# Patient Record
Sex: Female | Born: 1950 | Race: White | Hispanic: No | Marital: Married | Smoking: Former smoker
Health system: Southern US, Community
[De-identification: ages and names within clinical notes are randomized; demographics above are authoritative.]

## PROBLEM LIST (undated history)

## (undated) DIAGNOSIS — M1990 Unspecified osteoarthritis, unspecified site: Secondary | ICD-10-CM

## (undated) HISTORY — PX: TOTAL HIP ARTHROPLASTY: SHX124

## (undated) HISTORY — PX: BREAST BIOPSY: SHX20

---

## 2004-03-03 ENCOUNTER — Ambulatory Visit: Payer: Self-pay | Admitting: Family Medicine

## 2005-06-26 ENCOUNTER — Ambulatory Visit: Payer: Self-pay | Admitting: Family Medicine

## 2005-07-03 ENCOUNTER — Ambulatory Visit: Payer: Self-pay | Admitting: Gastroenterology

## 2006-07-12 ENCOUNTER — Ambulatory Visit: Payer: Self-pay | Admitting: Nurse Practitioner

## 2008-07-27 ENCOUNTER — Ambulatory Visit: Payer: Self-pay | Admitting: Family Medicine

## 2010-06-07 ENCOUNTER — Ambulatory Visit: Payer: Self-pay | Admitting: Family Medicine

## 2011-06-13 ENCOUNTER — Ambulatory Visit: Payer: Self-pay | Admitting: Family Medicine

## 2015-11-29 ENCOUNTER — Ambulatory Visit
Admission: EM | Admit: 2015-11-29 | Discharge: 2015-11-29 | Disposition: A | Discharge status: Home / Self Care | Payer: Medicare Other | Attending: Family Medicine | Admitting: Family Medicine

## 2015-11-29 ENCOUNTER — Ambulatory Visit (INDEPENDENT_AMBULATORY_CARE_PROVIDER_SITE_OTHER): Payer: Medicare Other

## 2015-11-29 DIAGNOSIS — S93401A Sprain of unspecified ligament of right ankle, initial encounter: Secondary | ICD-10-CM

## 2015-11-29 MED ORDER — HYDROCODONE-ACETAMINOPHEN 5-325 MG PO TABS: ORAL_TABLET | ORAL | 0 refills | Status: DC

## 2015-11-29 NOTE — ED Triage Notes (Signed)
Patient complains of right ankle pain that occurred last night after falling off of a step stool. Patient reports that she has noticed swelling and bruising and is unable to put pressure on her foot.

## 2015-11-29 NOTE — ED Provider Notes (Signed)
[facility]    CSN: [id number] Arrival date & time: 11/29/15  1742     History   Chief Complaint Chief Complaint  Patient presents with  . Ankle Pain    HPI Jane [name] is a 65 y.o. female.   65 yo female with a c/o right ankle pain since last night after falling of a step stool and twisting her foot. Today noticed more swelling and pain with weight bearing.    The history is provided by the patient.    History reviewed. No pertinent past medical history.  There are no active problems to display for this patient.   Past Surgical History:  Procedure Laterality Date  . BREAST BIOPSY Left   . TOTAL HIP ARTHROPLASTY Left     OB History    No data available       Home Medications    Prior to Admission medications   Medication Sig Start Date End Date Taking? Authorizing Provider  HYDROcodone-acetaminophen (NORCO/VICODIN) 5-325 MG tablet 1-2 tabs po bid prn 11/29/15   Norval Gable, MD    Family History No family history on file.  Social History Social History  Substance Use Topics  . Smoking status: Never Smoker  . Smokeless tobacco: Never Used  . Alcohol use Yes     Comment: occasionally     Allergies   Patient has no known allergies.   Review of Systems Review of Systems   Physical Exam Triage Vital Signs ED Triage Vitals  Enc Vitals Group     BP 11/29/15 1838 (!) 147/90     Pulse Rate 11/29/15 1838 95     Resp 11/29/15 1838 17     Temp 11/29/15 1838 98.9 F (37.2 C)     Temp Source 11/29/15 1838 Oral     SpO2 11/29/15 1838 97 %     Weight --      Height 11/29/15 1837 5' 9.5" (1.765 m)     Head Circumference --      Peak Flow --      Pain Score 11/29/15 1839 8     Pain Loc --      Pain Edu? --      Excl. in [place]? --    No data found.   Updated Vital Signs BP (!) 147/90 (BP Location: Left Arm)   Pulse 95   Temp 98.9 F (37.2 C) (Oral)   Resp 17   Ht 5' 9.5" (1.765 m)   SpO2 97%   Visual Acuity Right Eye  Distance:   Left Eye Distance:   Bilateral Distance:    Right Eye Near:   Left Eye Near:    Bilateral Near:     Physical Exam  Constitutional: She appears well-developed and well-nourished. No distress.  Musculoskeletal:       Right ankle: She exhibits swelling and ecchymosis. She exhibits normal range of motion, no deformity, no laceration and normal pulse. Tenderness. Medial malleolus tenderness found. Achilles tendon normal.  Skin: She is not diaphoretic.  Nursing note and vitals reviewed.    UC Treatments / Results  Labs (all labs ordered are listed, but only abnormal results are displayed) Labs Reviewed - No data to display  EKG  EKG Interpretation None       Radiology Dg Ankle Complete Right  Result Date: 11/29/2015 CLINICAL DATA:  Status post fall off stool, with injury to right ankle. Pain and swelling about the medial aspect of the right ankle. Initial encounter. EXAM:  RIGHT ANKLE - COMPLETE 3+ VIEW COMPARISON:  None. FINDINGS: There is no evidence of fracture or dislocation. The ankle mortise is intact; the interosseous space is within normal limits. No talar tilt or subluxation is seen. A plantar calcaneal spur is noted. The joint spaces are preserved. Diffuse soft tissue swelling is noted about the ankle. IMPRESSION: No evidence of fracture or dislocation. Electronically Signed   By: Garald Balding M.D.   On: 11/29/2015 19:04    Procedures Procedures (including critical care time)  Medications Ordered in UC Medications - No data to display   Initial Impression / Assessment and Plan / UC Course  I have reviewed the triage vital signs and the nursing notes.  Pertinent labs & imaging results that were available during my care of the patient were reviewed by me and considered in my medical decision making (see chart for details).  Clinical Course       Final Clinical Impressions(s) / UC Diagnoses   Final diagnoses:  Sprain of right ankle, unspecified  ligament, initial encounter    New Prescriptions Discharge Medication List as of 11/29/2015  7:25 PM    START taking these medications   Details  HYDROcodone-acetaminophen (NORCO/VICODIN) 5-325 MG tablet 1-2 tabs po bid prn, Print       1. x-ray results (negative for fracture) and diagnosis reviewed with patient 2. rx as per orders above; reviewed possible side effects, interactions, risks and benefits   3. Recommend supportive treatment with rest, ice, elevation, otc analgesics 4. Follow-up prn if symptoms worsen or don't improve   Norval Gable, MD 11/29/15 [id number]

## 2016-04-05 ENCOUNTER — Other Ambulatory Visit: Payer: Self-pay | Admitting: Family Medicine

## 2016-04-05 DIAGNOSIS — Z1231 Encounter for screening mammogram for malignant neoplasm of breast: Secondary | ICD-10-CM

## 2016-05-17 ENCOUNTER — Ambulatory Visit
Admission: RE | Admit: 2016-05-17 | Discharge: 2016-05-17 | Disposition: A | Discharge status: Home / Self Care | Payer: Medicare Other | Source: Ambulatory Visit | Attending: Family Medicine | Admitting: Family Medicine

## 2016-05-17 DIAGNOSIS — Z1231 Encounter for screening mammogram for malignant neoplasm of breast: Secondary | ICD-10-CM | POA: Diagnosis present

## 2016-11-29 ENCOUNTER — Other Ambulatory Visit: Payer: Self-pay

## 2016-11-29 ENCOUNTER — Encounter: Payer: Self-pay | Admitting: *Deleted

## 2016-12-08 ENCOUNTER — Ambulatory Visit: Payer: Medicare Other | Admitting: Anesthesiology

## 2016-12-08 ENCOUNTER — Encounter
Admission: RE | Disposition: A | Discharge status: Home / Self Care | Payer: Self-pay | Source: Ambulatory Visit | Attending: Unknown Physician Specialty

## 2016-12-08 ENCOUNTER — Ambulatory Visit
Admission: RE | Admit: 2016-12-08 | Discharge: 2016-12-08 | Disposition: A | Discharge status: Home / Self Care | Payer: Medicare Other | Source: Ambulatory Visit | Attending: Unknown Physician Specialty | Admitting: Unknown Physician Specialty

## 2016-12-08 DIAGNOSIS — Z79899 Other long term (current) drug therapy: Secondary | ICD-10-CM | POA: Diagnosis not present

## 2016-12-08 DIAGNOSIS — R0683 Snoring: Secondary | ICD-10-CM | POA: Insufficient documentation

## 2016-12-08 DIAGNOSIS — G5601 Carpal tunnel syndrome, right upper limb: Secondary | ICD-10-CM | POA: Diagnosis present

## 2016-12-08 DIAGNOSIS — Z96642 Presence of left artificial hip joint: Secondary | ICD-10-CM | POA: Insufficient documentation

## 2016-12-08 DIAGNOSIS — E669 Obesity, unspecified: Secondary | ICD-10-CM | POA: Diagnosis not present

## 2016-12-08 DIAGNOSIS — M1990 Unspecified osteoarthritis, unspecified site: Secondary | ICD-10-CM | POA: Diagnosis not present

## 2016-12-08 DIAGNOSIS — Z87891 Personal history of nicotine dependence: Secondary | ICD-10-CM | POA: Insufficient documentation

## 2016-12-08 DIAGNOSIS — Z820 Family history of epilepsy and other diseases of the nervous system: Secondary | ICD-10-CM | POA: Insufficient documentation

## 2016-12-08 HISTORY — PX: CARPAL TUNNEL RELEASE: SHX101

## 2016-12-08 HISTORY — DX: Unspecified osteoarthritis, unspecified site: M19.90

## 2016-12-08 SURGERY — CARPAL TUNNEL RELEASE
Anesthesia: General | Laterality: Right | Wound class: Clean

## 2016-12-08 MED ORDER — BUPIVACAINE HCL (PF) 0.5 % IJ SOLN
INTRAMUSCULAR | Status: DC | PRN
  Administered 2016-12-08: 7 mL

## 2016-12-08 MED ORDER — ONDANSETRON HCL 4 MG/2ML IJ SOLN: 4.0000 mg | Freq: Once | INTRAMUSCULAR | Status: DC | PRN

## 2016-12-08 MED ORDER — OXYCODONE HCL 5 MG/5ML PO SOLN: 5.0000 mg | Freq: Once | ORAL | Status: DC | PRN

## 2016-12-08 MED ORDER — PROPOFOL 10 MG/ML IV BOLUS
INTRAVENOUS | Status: DC | PRN
  Administered 2016-12-08: 150 mg via INTRAVENOUS

## 2016-12-08 MED ORDER — MIDAZOLAM HCL 5 MG/5ML IJ SOLN
INTRAMUSCULAR | Status: DC | PRN
  Administered 2016-12-08: 2 mg via INTRAVENOUS

## 2016-12-08 MED ORDER — DEXAMETHASONE SODIUM PHOSPHATE 4 MG/ML IJ SOLN
INTRAMUSCULAR | Status: DC | PRN
  Administered 2016-12-08: 8 mg via INTRAVENOUS

## 2016-12-08 MED ORDER — LACTATED RINGERS IV SOLN: INTRAVENOUS | Status: DC

## 2016-12-08 MED ORDER — LACTATED RINGERS IV SOLN
INTRAVENOUS | Status: DC
  Administered 2016-12-08: 09:00:00 via INTRAVENOUS

## 2016-12-08 MED ORDER — ACETAMINOPHEN 10 MG/ML IV SOLN: 1000.0000 mg | Freq: Once | INTRAVENOUS | Status: DC | PRN

## 2016-12-08 MED ORDER — LIDOCAINE HCL (CARDIAC) 20 MG/ML IV SOLN
INTRAVENOUS | Status: DC | PRN
  Administered 2016-12-08: 50 mg via INTRATRACHEAL

## 2016-12-08 MED ORDER — FENTANYL CITRATE (PF) 100 MCG/2ML IJ SOLN
INTRAMUSCULAR | Status: DC | PRN
  Administered 2016-12-08: 50 ug via INTRAVENOUS

## 2016-12-08 MED ORDER — ONDANSETRON HCL 4 MG/2ML IJ SOLN
INTRAMUSCULAR | Status: DC | PRN
  Administered 2016-12-08: 4 mg via INTRAVENOUS

## 2016-12-08 MED ORDER — NORCO 5-325 MG PO TABS: 1.0000 | ORAL_TABLET | Freq: Four times a day (QID) | ORAL | 0 refills | Status: AC | PRN

## 2016-12-08 MED ORDER — FENTANYL CITRATE (PF) 100 MCG/2ML IJ SOLN
25.0000 ug | INTRAMUSCULAR | Status: DC | PRN
Start: 2016-12-08 — End: 2016-12-08

## 2016-12-08 MED ORDER — OXYCODONE HCL 5 MG PO TABS: 5.0000 mg | ORAL_TABLET | Freq: Once | ORAL | Status: DC | PRN

## 2016-12-08 SURGICAL SUPPLY — 26 items
BANDAGE ELASTIC 2 LF NS (GAUZE/BANDAGES/DRESSINGS) ×3 IMPLANT
BNDG ESMARK 4X12 TAN STRL LF (GAUZE/BANDAGES/DRESSINGS) ×3 IMPLANT
BNDG STRETCH 4X75 STRL LF (GAUZE/BANDAGES/DRESSINGS) ×3 IMPLANT
COVER LIGHT HANDLE FLEXIBLE (MISCELLANEOUS) ×6 IMPLANT
CUFF TOURN SGL QUICK 18 (TOURNIQUET CUFF) ×3 IMPLANT
DURAPREP 26ML APPLICATOR (WOUND CARE) ×3 IMPLANT
GAUZE SPONGE 4X4 12PLY STRL (GAUZE/BANDAGES/DRESSINGS) ×3 IMPLANT
GLOVE BIO SURGEON STRL SZ7.5 (GLOVE) ×3 IMPLANT
GLOVE BIO SURGEON STRL SZ8 (GLOVE) ×6 IMPLANT
GLOVE INDICATOR 8.0 STRL GRN (GLOVE) ×3 IMPLANT
GOWN STRL REUS W/ TWL LRG LVL3 (GOWN DISPOSABLE) ×2 IMPLANT
GOWN STRL REUS W/TWL LRG LVL3 (GOWN DISPOSABLE) ×4
KIT ROOM TURNOVER OR (KITS) ×3 IMPLANT
NS IRRIG 500ML POUR BTL (IV SOLUTION) ×3 IMPLANT
PACK EXTREMITY ARMC (MISCELLANEOUS) ×3 IMPLANT
PAD GROUND ADULT SPLIT (MISCELLANEOUS) ×3 IMPLANT
PADDING CAST 2X4YD ST (MISCELLANEOUS) ×2
PADDING CAST BLEND 2X4 STRL (MISCELLANEOUS) ×1 IMPLANT
SOL PREP PVP 2OZ (MISCELLANEOUS) ×3
SOLUTION PREP PVP 2OZ (MISCELLANEOUS) ×1 IMPLANT
SPLINT CAST 1 STEP 3X12 (MISCELLANEOUS) ×3 IMPLANT
STOCKINETTE 4X48 STRL (DRAPES) ×3 IMPLANT
STRAP BODY AND KNEE 60X3 (MISCELLANEOUS) ×3 IMPLANT
SUT ETHILON 4-0 (SUTURE) ×2
SUT ETHILON 4-0 FS2 18XMFL BLK (SUTURE) ×1
SUTURE ETHLN 4-0 FS2 18XMF BLK (SUTURE) ×1 IMPLANT

## 2016-12-08 NOTE — Op Note (Signed)
DATE OF SURGERY:  12/08/2016  PATIENT NAME:  Jane [name]   DOB: 1950/05/20  MRN: [id number]  PRE-OPERATIVE DIAGNOSIS: Right carpal tunnel syndrome  POST-OPERATIVE DIAGNOSIS:  Same  PROCEDURE: Right carpal tunnel release  SURGEON: Dr. Leanor Kail, Brooke Bonito. M.D.  ANESTHESIA: Gen.   INDICATIONS FOR SURGERY: [name] is a 66 y.o. year old female with a long history of numbness and paresthesias in the right hand. Nerve conduction studies demonstrated findings consistent with moderate  median nerve compression.The patient had not seen any significant improvement despite conservative nonsurgical intervention. After discussion of the risks and benefits of surgical intervention, the patient expressed understanding of the risks benefits and agreed with plans for carpal tunnel release.   PROCEDURE IN DETAIL: The patient was taken the operating room where satisfactory general anesthesia was achieved. A tourniquet was placed on the patient's right upper arm.The right hand and arm were prepped  and draped in the usual sterile fashion. A "time-out" was performed as per usual protocol. The hand and forearm were exsanguinated using an Esmarch and the tourniquet was inflated to 250 mmHg.  An incision was made just ulnar to the thenar palmar crease. Dissection was carried down through the palmar fascia to the transverse carpal ligament. The transverse carpal ligament was sharply incised, taking care to protect the underlying structures within the carpal tunnel. Complete release of the transverse carpal ligament was achieved. There was no evidence of a mass or proliferative synovitis within the carpal tunnel. The median nerve did appear to be slightly flattened. The wound was irrigated with saline. The tourniquet was released at this time. It had been up for about 9 minutes. Bleeding was controlled with digital pressure and coagulation cautery. I did inject the subcutaneous tissue of the wound with about 5 cc  of 0.5% Marcaine without epinephrine. The skin was then re-approximated with interrupted sutures of #4-0 nylon. A sterile dressing was applied followed by application of a volar splint.  The patient was awakened and transferred to a stretcher bed.  The patient tolerated the procedure well and was transported to the PACU in stable condition. Blood loss was negligible.  Dr. Mariel Kansky. M.D.

## 2016-12-08 NOTE — Discharge Instructions (Signed)
General Anesthesia, Adult, Care After These instructions provide you with information about caring for yourself after your procedure. Your health care provider may also give you more specific instructions. Your treatment has been planned according to current medical practices, but problems sometimes occur. Call your health care provider if you have any problems or questions after your procedure. What can I expect after the procedure? After the procedure, it is common to have:  Vomiting.  A sore throat.  Mental slowness.  It is common to feel:  Nauseous.  Cold or shivery.  Sleepy.  Tired.  Sore or achy, even in parts of your body where you did not have surgery.  Follow these instructions at home: For at least 24 hours after the procedure:  Do not: ? Participate in activities where you could fall or become injured. ? Drive. ? Use heavy machinery. ? Drink alcohol. ? Take sleeping pills or medicines that cause drowsiness. ? Make important decisions or sign legal documents. ? Take care of children on your own.  Rest. Eating and drinking  If you vomit, drink water, juice, or soup when you can drink without vomiting.  Drink enough fluid to keep your urine clear or pale yellow.  Make sure you have little or no nausea before eating solid foods.  Follow the diet recommended by your health care provider. General instructions  Have a responsible adult stay with you until you are awake and alert.  Return to your normal activities as told by your health care provider. Ask your health care provider what activities are safe for you.  Take over-the-counter and prescription medicines only as told by your health care provider.  If you smoke, do not smoke without supervision.  Keep all follow-up visits as told by your health care provider. This is important. Contact a health care provider if:  You continue to have nausea or vomiting at home, and medicines are not helpful.  You  cannot drink fluids or start eating again.  You cannot urinate after 8-12 hours.  You develop a skin rash.  You have fever.  You have increasing redness at the site of your procedure. Get help right away if:  You have difficulty breathing.  You have chest pain.  You have unexpected bleeding.  You feel that you are having a life-threatening or urgent problem. This information is not intended to replace advice given to you by your health care provider. Make sure you discuss any questions you have with your health care provider. Document Released: 04/03/2000 Document Revised: 05/31/2015 Document Reviewed: 12/10/2014 Elsevier Interactive Patient Education  2018 [employer]  Elevation  RTC in about 2 weeks

## 2016-12-08 NOTE — Transfer of Care (Signed)
Immediate Anesthesia Transfer of Care Note  Patient: Jane [name]  Procedure(s) Performed: CARPAL TUNNEL RELEASE (Right )  Patient Location: PACU  Anesthesia Type: General  Level of Consciousness: awake, alert  and patient cooperative  Airway and Oxygen Therapy: Patient Spontanous Breathing and Patient connected to supplemental oxygen  Post-op Assessment: Post-op Vital signs reviewed, Patient's Cardiovascular Status Stable, Respiratory Function Stable, Patent Airway and No signs of Nausea or vomiting  Post-op Vital Signs: Reviewed and stable  Complications: No apparent anesthesia complications

## 2016-12-08 NOTE — Anesthesia Procedure Notes (Signed)
Procedure Name: LMA Insertion Performed by: Londell Moh, CRNA Pre-anesthesia Checklist: Patient identified, Emergency Drugs available, Suction available, Timeout performed and Patient being monitored Patient Re-evaluated:Patient Re-evaluated prior to induction Oxygen Delivery Method: Circle system utilized Preoxygenation: Pre-oxygenation with 100% oxygen Induction Type: IV induction LMA: LMA inserted LMA Size: 4.0 Number of attempts: 1 Placement Confirmation: positive ETCO2 and breath sounds checked- equal and bilateral Tube secured with: Tape

## 2016-12-08 NOTE — H&P (Signed)
  H and P reviewed. No changes. Uploaded at later date. 

## 2016-12-08 NOTE — Anesthesia Preprocedure Evaluation (Signed)
Anesthesia Evaluation  Patient identified by MRN, date of birth, ID band Patient awake    History of Anesthesia Complications Negative for: history of anesthetic complications  Airway Mallampati: II  TM Distance: >3 FB Neck ROM: Full    Dental  (+)    Pulmonary former smoker,  Snoring    Pulmonary exam normal breath sounds clear to auscultation       Cardiovascular Exercise Tolerance: Good negative cardio ROS Normal cardiovascular exam Rhythm:Regular Rate:Normal     Neuro/Psych negative neurological ROS     GI/Hepatic negative GI ROS,   Endo/Other  negative endocrine ROS  Renal/GU negative Renal ROS     Musculoskeletal  (+) Arthritis ,   Abdominal   Peds  Hematology negative hematology ROS (+)   Anesthesia Other Findings Obesity   Reproductive/Obstetrics                             Anesthesia Physical Anesthesia Plan  ASA: II  Anesthesia Plan: General   Post-op Pain Management:    Induction: Intravenous  PONV Risk Score and Plan: 2 and Ondansetron and Dexamethasone  Airway Management Planned: LMA  Additional Equipment:   Intra-op Plan:   Post-operative Plan: Extubation in OR  Informed Consent: I have reviewed the patients History and Physical, chart, labs and discussed the procedure including the risks, benefits and alternatives for the proposed anesthesia with the patient or authorized representative who has indicated his/her understanding and acceptance.     Plan Discussed with: CRNA  Anesthesia Plan Comments:         Anesthesia Quick Evaluation

## 2016-12-08 NOTE — Anesthesia Postprocedure Evaluation (Signed)
Anesthesia Post Note  Patient: Jane [name]  Procedure(s) Performed: CARPAL TUNNEL RELEASE (Right )  Patient location during evaluation: PACU Anesthesia Type: General Level of consciousness: awake and alert, oriented and patient cooperative Pain management: pain level controlled Vital Signs Assessment: post-procedure vital signs reviewed and stable Respiratory status: spontaneous breathing, nonlabored ventilation and respiratory function stable Cardiovascular status: blood pressure returned to baseline and stable Postop Assessment: adequate PO intake Anesthetic complications: no    Darrin Nipper

## 2017-02-23 ENCOUNTER — Other Ambulatory Visit: Payer: Self-pay | Admitting: Unknown Physician Specialty

## 2017-02-23 DIAGNOSIS — R200 Anesthesia of skin: Secondary | ICD-10-CM

## 2017-02-23 DIAGNOSIS — M5013 Cervical disc disorder with radiculopathy, cervicothoracic region: Secondary | ICD-10-CM

## 2017-03-13 ENCOUNTER — Ambulatory Visit
Admission: RE | Admit: 2017-03-13 | Discharge: 2017-03-13 | Disposition: A | Discharge status: Home / Self Care | Payer: Medicare Other | Source: Ambulatory Visit | Attending: Unknown Physician Specialty | Admitting: Unknown Physician Specialty

## 2017-03-13 ENCOUNTER — Encounter (INDEPENDENT_AMBULATORY_CARE_PROVIDER_SITE_OTHER): Payer: Self-pay

## 2017-03-13 DIAGNOSIS — R9389 Abnormal findings on diagnostic imaging of other specified body structures: Secondary | ICD-10-CM | POA: Insufficient documentation

## 2017-03-13 DIAGNOSIS — M5013 Cervical disc disorder with radiculopathy, cervicothoracic region: Secondary | ICD-10-CM | POA: Diagnosis not present

## 2017-03-13 DIAGNOSIS — R200 Anesthesia of skin: Secondary | ICD-10-CM

## 2017-04-19 ENCOUNTER — Encounter: Payer: Medicare Other | Attending: Nurse Practitioner | Admitting: Nurse Practitioner

## 2017-04-19 DIAGNOSIS — M1990 Unspecified osteoarthritis, unspecified site: Secondary | ICD-10-CM | POA: Diagnosis not present

## 2017-04-19 DIAGNOSIS — I252 Old myocardial infarction: Secondary | ICD-10-CM | POA: Diagnosis not present

## 2017-04-19 DIAGNOSIS — I10 Essential (primary) hypertension: Secondary | ICD-10-CM | POA: Insufficient documentation

## 2017-04-19 DIAGNOSIS — I739 Peripheral vascular disease, unspecified: Secondary | ICD-10-CM | POA: Insufficient documentation

## 2017-04-19 DIAGNOSIS — G629 Polyneuropathy, unspecified: Secondary | ICD-10-CM | POA: Insufficient documentation

## 2017-04-19 DIAGNOSIS — T8131XA Disruption of external operation (surgical) wound, not elsewhere classified, initial encounter: Secondary | ICD-10-CM | POA: Diagnosis present

## 2017-04-19 DIAGNOSIS — X58XXXA Exposure to other specified factors, initial encounter: Secondary | ICD-10-CM | POA: Insufficient documentation

## 2017-04-21 NOTE — Progress Notes (Signed)
Jane [name], Jane [name] ([id number]) Visit Report for 04/19/2017 Abuse/Suicide Risk Screen Details Patient Name: [name], [name] Date of Service: 04/19/2017 8:00 AM Medical Record Number: [id number] Patient Account Number: [id number] Date of Birth/Sex: [birth year] (66 y.o. Female) Treating RN: Montey Hora Primary Care Suzana Sohail: Lavera Guise Other Clinician: Referring Martin Belling: Meade Maw Treating Wofford Stratton/Extender: Cathie Olden in Treatment: 0 Abuse/Suicide Risk Screen Items Answer ABUSE/SUICIDE RISK SCREEN: Has anyone close to you tried to hurt or harm you recentlyo No Do you feel uncomfortable with anyone in your familyo No Has anyone forced you do things that you didnot want to doo No Do you have any thoughts of harming yourselfo No Patient displays signs or symptoms of abuse and/or neglect. No Electronic Signature(s) Signed: 04/19/2017 3:01:24 PM By: Montey Hora Entered By: Montey Hora on 04/19/2017 08:12:57 Jane [name] ([id number]) -------------------------------------------------------------------------------- Activities of Daily Living Details Patient Name: Jane Bouche A. Date of Service: 04/19/2017 8:00 AM Medical Record Number: [id number] Patient Account Number: [id number] Date of Birth/Sex: [birth year] (66 y.o. Female) Treating RN: Montey Hora Primary Care Sedra Morfin: Lavera Guise Other Clinician: Referring Brixton Franko: Meade Maw Treating Laiza Veenstra/Extender: Cathie Olden in Treatment: 0 Activities of Daily Living Items Answer Activities of Daily Living (Please select one for each item) Drive Automobile Not Able Take Medications Completely Able Use Telephone Completely Able Care for Appearance Completely Able Use Toilet Completely Able Bath / Shower Completely Able Dress Self Need Assistance Feed Self Completely Able Walk Completely Able Get In / Out Bed Completely [place] for Self Completely Able Electronic Signature(s) Signed: 04/19/2017 3:01:24 PM By: Montey Hora Entered By: Montey Hora on 04/19/2017 08:13:36 Jane [name] ([id number]) -------------------------------------------------------------------------------- Education Assessment Details Patient Name: Jane Bouche A. Date of Service: 04/19/2017 8:00 AM Medical Record Number: [id number] Patient Account Number: [id number] Date of Birth/Sex: 16-Jun-1950 (66 y.o. Female) Treating RN: Montey Hora Primary Care Gamaliel Charney: Lavera Guise Other Clinician: Referring Ragnar Waas: Meade Maw Treating Randol Zumstein/Extender: Cathie Olden in Treatment: 0 Primary Learner Assessed: Patient Learning Preferences/Education Level/Primary Language Learning Preference: Explanation, Demonstration Highest Education Level: College or Above Preferred Language: English Cognitive Barrier Assessment/Beliefs Language Barrier: No Translator Needed: No Memory Deficit: No Emotional Barrier: No Cultural/Religious Beliefs Affecting Medical Care: No Physical Barrier Assessment Impaired Vision: No Impaired Hearing: No Decreased Hand dexterity: No Knowledge/Comprehension Assessment Knowledge Level: Medium Comprehension Level: Medium Ability to understand written Medium instructions: Ability to understand verbal Medium instructions: Motivation Assessment Anxiety Level: Calm Cooperation: Cooperative Education Importance: Acknowledges Need Interest in Health Problems: Asks Questions Perception: Coherent Willingness to Engage in Self- Medium Management Activities: Readiness to Engage in Self- Medium Management Activities: Electronic Signature(s) Signed: 04/19/2017 3:01:24 PM By: Montey Hora Entered By: Montey Hora on 04/19/2017 08:13:57 Jane [name] ([id number]) -------------------------------------------------------------------------------- Fall Risk Assessment  Details Patient Name: Jane Bouche A. Date of Service: 04/19/2017 8:00 AM Medical Record Number: [id number] Patient Account Number: [id number] Date of Birth/Sex: June 13, 1950 (66 y.o. Female) Treating RN: Montey Hora Primary Care Pocahontas Cohenour: Lavera Guise Other Clinician: Referring Davinder Haff: Meade Maw Treating Marian Meneely/Extender: Cathie Olden in Treatment: 0 Fall Risk Assessment Items Have you had 2 or more falls in the last 12 monthso 0 No Have you had any fall that resulted in injury in the last 12 monthso 0 No FALL RISK ASSESSMENT: History of falling - immediate or within 3 months 0 No Secondary diagnosis 0 No Ambulatory aid None/bed rest/wheelchair/nurse 0 Yes Crutches/cane/walker 0 No Furniture 0 No IV Access/Saline  Lock 0 No Gait/Training Normal/bed rest/immobile 0 Yes Weak 10 Yes Impaired 0 No Mental Status Oriented to own ability 0 Yes Electronic Signature(s) Signed: 04/19/2017 3:01:24 PM By: Montey Hora Entered By: Montey Hora on 04/19/2017 08:14:17 Jane [name] ([id number]) -------------------------------------------------------------------------------- Nutrition Risk Assessment Details Patient Name: Jane Bouche A. Date of Service: 04/19/2017 8:00 AM Medical Record Number: [id number] Patient Account Number: [id number] Date of Birth/Sex: 1950/02/11 (66 y.o. Female) Treating RN: Montey Hora Primary Care Filippo Puls: Lavera Guise Other Clinician: Referring Jayson Waterhouse: Meade Maw Treating Allayah Raineri/Extender: Cathie Olden in Treatment: 0 Height (in): Weight (lbs): Body Mass Index (BMI): Nutrition Risk Assessment Items NUTRITION RISK SCREEN: I have an illness or condition that made me change the kind and/or amount of 0 No food I eat I eat fewer than two meals per day 0 No I eat few fruits and vegetables, or milk products 0 No I have three or more drinks of beer, liquor or wine almost every day 0 No I have tooth or mouth problems  that make it hard for me to eat 0 No I don't always have enough money to buy the food I need 0 No I eat alone most of the time 0 No I take three or more different prescribed or over-the-counter drugs a day 1 Yes Without wanting to, I have lost or gained 10 pounds in the last six months 0 No I am not always physically able to shop, cook and/or feed myself 0 No Nutrition Protocols Good Risk Protocol 0 No interventions needed Moderate Risk Protocol Electronic Signature(s) Signed: 04/19/2017 3:01:24 PM By: Montey Hora Entered By: Montey Hora on 04/19/2017 08:14:22

## 2017-04-23 ENCOUNTER — Other Ambulatory Visit: Payer: Self-pay | Admitting: Neurosurgery

## 2017-04-23 DIAGNOSIS — D480 Neoplasm of uncertain behavior of bone and articular cartilage: Secondary | ICD-10-CM

## 2017-04-23 DIAGNOSIS — G9589 Other specified diseases of spinal cord: Secondary | ICD-10-CM

## 2017-04-26 ENCOUNTER — Encounter: Payer: Medicare Other | Admitting: Nurse Practitioner

## 2017-04-26 DIAGNOSIS — T8131XA Disruption of external operation (surgical) wound, not elsewhere classified, initial encounter: Secondary | ICD-10-CM | POA: Diagnosis not present

## 2017-04-27 NOTE — Progress Notes (Signed)
BRONTE, SABADO ([id number]) Visit Report for 04/19/2017 Chief Complaint Document Details Patient Name: Jane, [name] A. Date of Service: 04/19/2017 8:00 AM Medical Record Number: [id number] Patient Account Number: [id number] Date of Birth/Sex: 1950-06-10 (66 y.o. F) Treating RN: Ahmed Prima Primary Care Provider: Lavera Guise Other Clinician: Referring Provider: Lavera Guise Treating Provider/Extender: Cathie Olden in Treatment: 0 Information Obtained from: Patient Chief Complaint She is here for evaluation of a cervical neck surgical site dehiscence Electronic Signature(s) Signed: 04/19/2017 10:50:43 AM By: Lawanda Cousins Entered By: Lawanda Cousins on 04/19/2017 10:50:43 Hanline, Shevawn A. ([id number]) -------------------------------------------------------------------------------- Debridement Details Patient Name: Jane Bouche A. Date of Service: 04/19/2017 8:00 AM Medical Record Number: [id number] Patient Account Number: [id number] Date of Birth/Sex: 20-Jun-1950 (66 y.o. F) Treating RN: Ahmed Prima Primary Care Provider: Lavera Guise Other Clinician: Referring Provider: Lavera Guise Treating Provider/Extender: Cathie Olden in Treatment: 0 Debridement Performed for Wound #1 Posterior Neck Assessment: Performed By: Physician Lawanda Cousins, NP Debridement Type: Debridement Pre-procedure Verification/Time Yes - 08:43 Out Taken: Start Time: 08:44 Pain Control: Lidocaine 4% Topical Solution Total Area Debrided (L x W): 0.8 (cm) x 0.8 (cm) = 0.64 (cm) Tissue and other material Non-Viable, Slough, Subcutaneous, Fibrin/Exudate, Slough debrided: Level: Skin/Subcutaneous Tissue Debridement Description: Excisional Instrument: Curette Bleeding: Minimum Hemostasis Achieved: Pressure End Time: 08:46 Procedural Pain: 0 Post Procedural Pain: 0 Response to Treatment: Procedure was tolerated well Post Debridement Measurements of Total Wound Length: (cm) 0.8 Width:  (cm) 0.8 Depth: (cm) 0.7 Volume: (cm) 0.352 Character of Wound/Ulcer Post Debridement: Requires Further Debridement Post Procedure Diagnosis Same as Pre-procedure Electronic Signature(s) Signed: 04/19/2017 10:37:20 AM By: Lawanda Cousins Signed: 04/23/2017 4:22:27 PM By: Alric Quan Entered By: Lawanda Cousins on 04/19/2017 10:37:19 Achorn, [name] ([id number]) -------------------------------------------------------------------------------- HPI Details Patient Name: Jane Bouche A. Date of Service: 04/19/2017 8:00 AM Medical Record Number: [id number] Patient Account Number: [id number] Date of Birth/Sex: [birth year] (66 y.o. F) Treating RN: Ahmed Prima Primary Care Provider: Lavera Guise Other Clinician: Referring Provider: Lavera Guise Treating Provider/Extender: Cathie Olden in Treatment: 0 History of Present Illness HPI Description: 04/19/17-She is here for evaluation of a cervical neck wound dehiscence. 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 Sutures were removed on 4/4 there is no disc noted concerned and the crevice of the posterior cervical spine. She'll follow-up appointment on 4/9 where she was prescribed Augmentin for 2 weeks and consult to wound clinic. She presents with limited range of motion to her neck which is not new postoperatively. There is a small area of nonviable tissue and a natural crevice in her posterior neck. We will treat with santyl, they are to call if unable to afford. She will follow up next week. Electronic Signature(s) Signed: 04/19/2017 11:02:51 AM By: Lawanda Cousins Previous Signature: 04/19/2017 10:53:37 AM Version By: Lawanda Cousins Entered By: Lawanda Cousins on 04/19/2017 11:02:50 Jane [name] ([id number]) -------------------------------------------------------------------------------- Physical Exam Details Patient Name: Jane, CUBERO A. Date of Service: 04/19/2017 8:00  AM Medical Record Number: [id number] Patient Account Number: [id number] Date of Birth/Sex: Aug 22, 1950 (66 y.o. F) Treating RN: Ahmed Prima Primary Care Provider: Lavera Guise Other Clinician: Referring Provider: Lavera Guise Treating Provider/Extender: Cathie Olden in Treatment: 0 Respiratory respirations are even and unlabored. clear throughout. Cardiovascular s1 s2 regular rate and rhythm. Musculoskeletal ambulates with no assistive devices. Psychiatric appears to have appropriate insight and judgement to medical care. oriented x4. calm, cooperative. Electronic Signature(s) Signed: 04/19/2017 11:31:40 AM By: Lawanda Cousins  Entered By: Lawanda Cousins on 04/19/2017 11:31:39 Jane [name] ([id number]) -------------------------------------------------------------------------------- Physician Orders Details Patient Name: Jane Bouche A. Date of Service: 04/19/2017 8:00 AM Medical Record Number: [id number] Patient Account Number: [id number] Date of Birth/Sex: [birth year] (66 y.o. F) Treating RN: Ahmed Prima Primary Care Provider: Lavera Guise Other Clinician: Referring Provider: Lavera Guise Treating Provider/Extender: Cathie Olden in Treatment: 0 Verbal / Phone Orders: Yes Clinician: Pinkerton, Debi Read Back and Verified: Yes Diagnosis Coding Wound Cleansing Wound #1 Posterior Neck o Clean wound with Normal Saline. o Cleanse wound with mild soap and water Anesthetic (add to Medication List) Wound #1 Posterior Neck o Topical Lidocaine 4% cream applied to wound bed prior to debridement (In Clinic Only). Skin Barriers/Peri-Wound Care Wound #1 Posterior Neck o Skin Prep Primary Wound Dressing Wound #1 Posterior Neck o Saline moistened gauze o Santyl Ointment Secondary Dressing Wound #1 Posterior Neck o Dry Gauze o Boardered Foam Dressing - or telfa island Dressing Change Frequency Wound #1 Posterior Neck o Change dressing every  day. Follow-up Appointments Wound #1 Posterior Neck o Return Appointment in 1 week. Additional Orders / Instructions Wound #1 Posterior Neck o Increase protein intake. o Other: - multivitamin Patient Medications Allergies: No Known Allergies Notifications Medication Indication Start End lidocaine Jane [name], [name] ([id number]) Notifications Medication Indication Start End DOSE 1 - topical 4 % cream - 1 cream topical Santyl 04/20/2017 DOSE topical 250 unit/gram ointment - ointment topical to wound daily Electronic Signature(s) Signed: 04/19/2017 10:24:33 AM By: Lawanda Cousins Entered By: Lawanda Cousins on 04/19/2017 10:24:32 Jane [name] ([id number]) -------------------------------------------------------------------------------- Prescription 04/19/2017 Patient Name: Jane Bouche A. Provider: Lawanda Cousins NP Date of Birth: Aug 25, 1950 NPI#: [id number] Sex: F DEA#: [id number] Phone #: [phone] License #: Patient Address: [facility] [place], [place] [address] [address], [place], [place] [address] [phone] Allergies No Known Allergies Medication Medication: Route: Strength: Form: lidocaine 4 % topical cream topical 4% cream Class: TOPICAL LOCAL ANESTHETICS Dose: Frequency / Time: Indication: 1 1 cream topical Number of Refills: Number of Units: 0 Generic Substitution: Start Date: End Date: One Time Use: Substitution Permitted No Note to Pharmacy: Signature(s): Date(s): Electronic Signature(s) Signed: 04/19/2017 3:26:23 PM By: Lawanda Cousins Entered By: Lawanda Cousins on 04/19/2017 10:24:34 Jane [name], Jane [name] ([id number]) --------------------------------------------------------------------------------  Problem List Details Patient Name: Jane Bouche A. Date of Service: 04/19/2017 8:00 AM Medical Record Number: [id number] Patient Account Number:  [id number] Date of Birth/Sex: Apr 06, 1950 (66 y.o. 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 ([id number]) -------------------------------------------------------------------------------- Progress Note Details Patient Name: Jane Bouche A. Date of Service: 04/19/2017 8:00 AM Medical Record Number: [id number] Patient Account Number: [id number] Date of Birth/Sex: 05-Aug-1950 (66 y.o. F) Treating RN: Ahmed Prima Primary Care Provider: Lavera Guise Other Clinician: Referring Provider: Lavera Guise Treating Provider/Extender: Cathie Olden in Treatment: 0 Subjective Chief Complaint Information obtained from Patient She is here for evaluation of a cervical neck surgical site dehiscence History of Present Illness (HPI)  04/19/17-She is here for evaluation of a cervical neck wound dehiscence. 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 Sutures were removed on 4/4 there is no disc noted concerned and the crevice of the posterior cervical spine. She'll follow-up appointment on 4/9 where she was prescribed Augmentin for 2 weeks and consult to wound clinic. She presents with limited range of motion to her neck which is not new postoperatively. There is a small area of nonviable tissue and a natural crevice  in her posterior neck. We will treat with santyl, they are to call if unable to afford. She will follow up next week. Wound History Patient presents with 1 open wound that has been present for approximately 3 weeks. Patient has been treating wound in the following manner: open to air. Laboratory tests have not been performed in the last month. Patient reportedly has not tested positive for an antibiotic resistant organism. Patient reportedly has not tested positive for osteomyelitis. Patient reportedly has not had testing performed to evaluate circulation in the legs. Patient History Information obtained from Patient. Allergies No Known Allergies Family History Lung Disease - Father, No family history of Cancer, Diabetes, Heart Disease, Hereditary Spherocytosis, Hypertension, Kidney Disease, Seizures, Stroke, Thyroid Problems, Tuberculosis. Social History Never smoker, Marital Status - Married, Alcohol Use - Rarely, Drug Use - No History, Caffeine Use - Daily. Medical History Hematologic/Lymphatic Denies history of Anemia, Hemophilia, Human Immunodeficiency Virus, Lymphedema, Sickle Cell Disease Respiratory Denies history of Aspiration, Asthma, Chronic Obstructive Pulmonary Disease (COPD), Pneumothorax, Sleep Apnea, Tuberculosis Cardiovascular Denies history of Angina, Arrhythmia, Congestive Heart Failure, Coronary Artery Disease, Deep Vein Thrombosis, Hypertension, Hypotension, Myocardial Infarction, Peripheral Arterial Disease, Peripheral Venous Disease, Phlebitis, Vasculitis Gastrointestinal Denies history of Cirrhosis , Colitis, Crohn s, Hepatitis A, Hepatitis B, Hepatitis C Jane [name], [name] ([id number]) Endocrine Denies history of Type I Diabetes, Type II Diabetes Immunological Denies history of Lupus Erythematosus, Raynaud s, Scleroderma Musculoskeletal Patient has history of Osteoarthritis Denies history of Gout, Rheumatoid Arthritis, Osteomyelitis Neurologic Patient has  history of Neuropathy - right hand Denies history of Dementia, Quadriplegia, Paraplegia, Seizure Disorder Oncologic Denies history of Received Chemotherapy, Received Radiation Medical And Surgical History Notes Musculoskeletal spinal cord mass removed and screws inserted March 26 2017 Review of Systems (ROS) Constitutional Symptoms (General Health) The patient has no complaints or symptoms. Eyes The patient has no complaints or symptoms. Ear/Nose/Mouth/Throat The patient has no complaints or symptoms. Hematologic/Lymphatic The patient has no complaints or symptoms. Respiratory The patient has no complaints or symptoms. Cardiovascular The patient has no complaints or symptoms. Gastrointestinal The patient has no complaints or symptoms. Genitourinary The patient has no complaints or symptoms. Immunological The patient has no complaints or symptoms. Integumentary (Skin) The patient has no complaints or symptoms. Musculoskeletal The patient has no complaints or symptoms. Neurologic The patient has no complaints or symptoms. Oncologic The patient has no complaints or symptoms. Psychiatric The patient has no complaints or symptoms. Objective Constitutional Vitals Time Taken: 8:21 AM, Height: 69 in, Source: Measured, Weight: 260 lbs, Source: Measured, BMI: 38.4, Temperature: Jane [name], [name] ([id number]) 98.3 F, Pulse: 60 bpm, Respiratory Rate: 18 breaths/min, Blood Pressure: 133/83 mmHg. Respiratory respirations are even and unlabored. clear throughout. Cardiovascular s1 s2 regular rate and rhythm. Musculoskeletal ambulates with no assistive devices. Psychiatric appears to have appropriate insight and judgement to medical care. oriented x4. calm, cooperative. Integumentary (Hair, Skin) Wound #1 status is Open. Original cause of wound was Surgical Injury.  The wound is located on the Posterior Neck. The wound measures 0.8cm length x 0.8cm width x 0.6cm depth; 0.503cm^2  area and 0.302cm^3 volume. There is Fat Layer (Subcutaneous Tissue) Exposed exposed. There is no tunneling or undermining noted. There is a small amount of serous drainage noted. The wound margin is flat and intact. There is no granulation within the wound bed. There is a large (67-100%) amount of necrotic tissue within the wound bed including Adherent Slough. The periwound skin appearance exhibited: Ecchymosis. The periwound skin appearance did not exhibit: Callus, Crepitus, Excoriation, Induration, Rash, Scarring, Dry/Scaly, Maceration, Atrophie Blanche, Cyanosis, Hemosiderin Staining, Mottled, Pallor, Rubor, Erythema. Periwound temperature was noted as No Abnormality. 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 There was a Excisional Skin/Subcutaneous Tissue Debridement with a total area of 0.64 sq cm performed by Lawanda Cousins, NP. With the following instrument(s): Curette. to remove Non-Viable tissue/material Material removed includes Subcutaneous Tissue, and Slough, [place] after achieving pain control using Lidocaine 4% Topical Solution. No specimens were taken. A time out was conducted at 08:43, prior to the start of the procedure. A Minimum amount of bleeding was controlled with Pressure. The procedure was tolerated well with a pain level of 0 throughout and a pain level of 0 following the procedure. Post Debridement Measurements: 0.8cm length x 0.8cm width x 0.7cm depth; 0.352cm^3 volume. Character of Wound/Ulcer Post Debridement requires further debridement. Post procedure Diagnosis Wound #1: Same as Pre-Procedure Jane [name], [name] ([id number]) Plan Wound Cleansing: Wound  #1 Posterior Neck: Clean wound with Normal Saline. Cleanse wound with mild soap and water Anesthetic (add to Medication List): Wound #1 Posterior Neck: Topical Lidocaine 4% cream applied to wound bed prior to debridement (In Clinic Only). Skin Barriers/Peri-Wound Care: Wound #1 Posterior Neck: Skin Prep Primary Wound Dressing: Wound #1 Posterior Neck: Saline moistened gauze Santyl Ointment Secondary Dressing: Wound #1 Posterior Neck: Dry Gauze Boardered Foam Dressing - or telfa island Dressing Change Frequency: Wound #1 Posterior Neck: Change dressing every day. Follow-up Appointments: Wound #1 Posterior Neck: Return Appointment in 1 week. Additional Orders / Instructions: Wound #1 Posterior Neck: Increase protein intake. Other: - multivitamin The following medication(s) was prescribed: lidocaine topical 4 % cream 1 1 cream topical was prescribed at facility Santyl topical 250 unit/gram ointment ointment topical to wound daily starting 04/20/2017 1. Continue with Augmentin 2. Probiotic or yogurt while taking antibiotic 3. Santyl daily 4. Follow-up next week Electronic Signature(s) Signed: 04/19/2017 11:32:39 AM By: Lawanda Cousins Entered By: Lawanda Cousins on 04/19/2017 11:32:39 Jane [name] ([id number]) -------------------------------------------------------------------------------- ROS/PFSH Details Patient Name: Jane Bouche A. Date of Service: 04/19/2017 8:00 AM Medical Record Number: [id number] Patient Account Number: [id number] Date of Birth/Sex: 1950-08-16 (66 y.o. F) Treating RN: Jane [name] Primary Care Provider: Lavera Guise Other Clinician: Referring Provider: Lavera Guise Treating Provider/Extender: Cathie Olden in Treatment: 0 Information Obtained From Patient Wound History Do you currently have one or more open woundso Yes How many open wounds do you currently haveo 1 Approximately how long have you had your woundso 3 weeks How have you been  treating your wound(s) until nowo open to air Has your wound(s) ever healed and then re-openedo No Have you had any lab work done in the  past montho No Have you tested positive for an antibiotic resistant organism (MRSA, VRE)o No Have you tested positive for osteomyelitis (bone infection)o No Have you had any tests for circulation on your legso No Constitutional Symptoms (General Health) Complaints and Symptoms: No Complaints or Symptoms Eyes Complaints and Symptoms: No Complaints or Symptoms Ear/Nose/Mouth/Throat Complaints and Symptoms: No Complaints or Symptoms Hematologic/Lymphatic Complaints and Symptoms: No Complaints or Symptoms Medical History: Negative for: Anemia; Hemophilia; Human Immunodeficiency Virus; Lymphedema; Sickle Cell Disease Respiratory Complaints and Symptoms: No Complaints or Symptoms Medical History: Negative for: Aspiration; Asthma; Chronic Obstructive Pulmonary Disease (COPD); Pneumothorax; Sleep Apnea; Tuberculosis Cardiovascular Complaints and Symptoms: No Complaints or Symptoms Jane [name], [name] ([id number]) Medical History: Negative for: Angina; Arrhythmia; Congestive Heart Failure; Coronary Artery Disease; Deep Vein Thrombosis; Hypertension; Hypotension; Myocardial Infarction; Peripheral Arterial Disease; Peripheral Venous Disease; Phlebitis; Vasculitis Gastrointestinal Complaints and Symptoms: No Complaints or Symptoms Medical History: Negative for: Cirrhosis ; Colitis; Crohnos; Hepatitis A; Hepatitis B; Hepatitis C Endocrine Medical History: Negative for: Type I Diabetes; Type II Diabetes Genitourinary Complaints and Symptoms: No Complaints or Symptoms Immunological Complaints and Symptoms: No Complaints or Symptoms Medical History: Negative for: Lupus Erythematosus; Raynaudos; Scleroderma Integumentary (Skin) Complaints and Symptoms: No Complaints or Symptoms Musculoskeletal Complaints and Symptoms: No Complaints or  Symptoms Medical History: Positive for: Osteoarthritis Negative for: Gout; Rheumatoid Arthritis; Osteomyelitis Past Medical History Notes: spinal cord mass removed and screws inserted March 26 2017 Neurologic Complaints and Symptoms: No Complaints or Symptoms Medical History: Positive for: Neuropathy - right hand Negative for: Dementia; Quadriplegia; Paraplegia; Seizure Disorder Oncologic Complaints and Symptoms: No Complaints or Symptoms Jane [name], [name] ([id number]) Medical History: Negative for: Received Chemotherapy; Received Radiation Psychiatric Complaints and Symptoms: No Complaints or Symptoms Immunizations Pneumococcal Vaccine: Received Pneumococcal Vaccination: Yes Immunization Notes: up to date Implantable Devices Family and Social History Cancer: No; Diabetes: No; Heart Disease: No; Hereditary Spherocytosis: No; Hypertension: No; Kidney Disease: No; Lung Disease: Yes - Father; Seizures: No; Stroke: No; Thyroid Problems: No; Tuberculosis: No; Never smoker; Marital Status - Married; Alcohol Use: Rarely; Drug Use: No History; Caffeine Use: Daily; Financial Concerns: No; Food, Clothing or Shelter Needs: No; Support System Lacking: No; Transportation Concerns: No; Advanced Directives: No; Patient does not want information on Advanced Directives Electronic Signature(s) Signed: 04/19/2017 3:01:24 PM By: Jane [name] Signed: 04/19/2017 3:26:23 PM By: Lawanda Cousins Entered By: Jane [name] on 04/19/2017 08:20:52 Jane [name] ([id number]) -------------------------------------------------------------------------------- SuperBill Details Patient Name: Jane Bouche A. Date of Service: 04/19/2017 Medical Record Number: [id number] Patient Account Number: [id number] Date of Birth/Sex: 29-May-1950 (67 y.o. 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 G95.9 Disease of spinal cord, unspecified S11.80XS Unspecified open wound of other specified part of neck, sequela Facility Procedures CPT4 Code: 46270350 Description: 09381 - WOUND CARE VISIT-LEV 2 EST PT Modifier: Quantity: 1 CPT4 Code: 82993716 Description: [facility] - DEB SUBQ TISSUE 20 SQ CM/< ICD-10 Diagnosis Description S11.80XS Unspecified open wound of other specified part of neck, se Modifier: quela Quantity: 1 Physician Procedures CPT4 Code: 9678938 Description: WC PHYS LEVEL 3 o NEW PT ICD-10 Diagnosis Description B01.7 Neoplasm of uncertain behavior of bone and articular cart G95.9 Disease of spinal cord, unspecified S11.80XS Unspecified open wound of other specified part of neck, s Modifier: ilage equela Quantity: 1 CPT4 Code: [id number] Description: 11042 - WC PHYS  SUBQ TISS 20 SQ CM ICD-10 Diagnosis Description S11.80XS Unspecified open wound of other specified part of neck, s Modifier: equela Quantity: 1 Electronic Signature(s) Signed: 04/19/2017 2:29:37 PM By: Alric Quan Signed: 04/19/2017 3:26:23 PM By: Lawanda Cousins Previous Signature: 04/19/2017 11:33:01 AM Version By: Lawanda Cousins Entered By: Alric Quan on 04/19/2017 14:29:36

## 2017-04-27 NOTE — Progress Notes (Signed)
GISSELLA, NIBLACK ([id number]) Visit Report for 04/19/2017 Allergy List Details Patient Name: Jane [name], [name] Date of Service: 04/19/2017 8:00 AM Medical Record Number: [id number] Patient Account Number: [id number] Date of Birth/Sex: Aug 23, 1950 (66 y.o. F) Treating RN: Montey Hora Primary Care Blimie Vaness: Lavera Guise Other Clinician: Referring Trayce Caravello: Lavera Guise Treating Ashlen Kiger/Extender: Lawanda Cousins Weeks in Treatment: 0 Allergies Active Allergies No Known Allergies Allergy Notes Electronic Signature(s) Signed: 04/19/2017 3:01:24 PM By: Montey Hora Entered By: Montey Hora on 04/19/2017 08:12:35 [name] ([id number]) -------------------------------------------------------------------------------- Arrival Information Details Patient Name: Jane Bouche A. Date of Service: 04/19/2017 8:00 AM Medical Record Number: [id number] Patient Account Number: [id number] Date of Birth/Sex: March 08, 1950 (66 y.o. F) Treating RN: Montey Hora Primary Care Nephtali Docken: Lavera Guise Other Clinician: Referring Cj Edgell: Lavera Guise Treating Elija Mccamish/Extender: Cathie Olden in Treatment: 0 Visit Information Patient Arrived: Ambulatory Arrival Time: 08:11 Accompanied By: spouse Transfer Assistance: None Patient Identification Verified: Yes Secondary Verification Process Completed: Yes Electronic Signature(s) Signed: 04/19/2017 3:01:24 PM By: Montey Hora Entered By: Montey Hora on 04/19/2017 08:12:26 [name] ([id number]) -------------------------------------------------------------------------------- Clinic Level of Care Assessment Details Patient Name: Jane Bouche A. Date of Service: 04/19/2017 8:00 AM Medical Record Number: [id number] Patient Account Number: [id number] Date of Birth/Sex: 1951/01/08 (66 y.o. F) Treating RN: Ahmed Prima Primary Care Jakyra Kenealy: Lavera Guise Other Clinician: Referring Trellis Guirguis: Lavera Guise Treating Irvine Glorioso/Extender:  Cathie Olden in Treatment: 0 Clinic Level of Care Assessment Items TOOL 1 Quantity Score X - Use when EandM and Procedure is performed on INITIAL visit 1 0 ASSESSMENTS - Nursing Assessment / Reassessment X - General Physical Exam (combine w/ comprehensive assessment (listed just below) when 1 20 performed on new pt. evals) X- 1 25 Comprehensive Assessment (HX, ROS, Risk Assessments, Wounds Hx, etc.) ASSESSMENTS - Wound and Skin Assessment / Reassessment []  - Dermatologic / Skin Assessment (not related to wound area) 0 ASSESSMENTS - Ostomy and/or Continence Assessment and Care []  - Incontinence Assessment and Management 0 []  - 0 Ostomy Care Assessment and Management (repouching, etc.) PROCESS - Coordination of Care X - Simple Patient / Family Education for ongoing care 1 15 []  - 0 Complex (extensive) Patient / Family Education for ongoing care []  - 0 Staff obtains Programmer, systems, Records, Test Results / Process Orders []  - 0 Staff telephones HHA, Nursing Homes / Clarify orders / etc []  - 0 Routine Transfer to another Facility (non-emergent condition) []  - 0 Routine Hospital Admission (non-emergent condition) X- 1 15 New Admissions / Biomedical engineer / Ordering NPWT, Apligraf, etc. []  - 0 Emergency Hospital Admission (emergent condition) PROCESS - Special Needs []  - Pediatric / Minor Patient Management 0 []  - 0 Isolation Patient Management []  - 0 Hearing / Language / Visual special needs []  - 0 Assessment of Community assistance (transportation, D/C planning, etc.) []  - 0 Additional assistance / Altered mentation []  - 0 Support Surface(s) Assessment (bed, cushion, seat, etc.) Chavarin, Verdie A. ([id number]) INTERVENTIONS - Miscellaneous []  - External ear exam 0 []  - 0 Patient Transfer (multiple staff / Civil Service fast streamer / Similar devices) []  - 0 Simple Staple / Suture removal (25 or less) []  - 0 Complex Staple / Suture removal (26 or more) []  - 0 Hypo/Hyperglycemic  Management (do not check if billed separately) []  - 0 Ankle / Brachial Index (ABI) - do not check if billed separately Has the patient been seen at the hospital within the last three years: Yes Total Score: 75 Level Of Care: New/Established - Level 2 Electronic Signature(s) Signed:  04/23/2017 4:22:27 PM By: Alric Quan Entered By: Alric Quan on 04/19/2017 14:29:27 [name] ([id number]) -------------------------------------------------------------------------------- Encounter Discharge Information Details Patient Name: Jane Bouche A. Date of Service: 04/19/2017 8:00 AM Medical Record Number: [id number] Patient Account Number: [id number] Date of Birth/Sex: [birth year] (66 y.o. F) Treating RN: Ahmed Prima Primary Care Lydia Meng: Lavera Guise Other Clinician: Referring Zakya Halabi: Lavera Guise Treating Sahana Boyland/Extender: Cathie Olden in Treatment: 0 Encounter Discharge Information Items Discharge Pain Level: 0 Discharge Condition: Stable Ambulatory Status: Ambulatory Discharge Destination: Home Transportation: Private Auto Accompanied By: husband Schedule Follow-up Appointment: Yes Medication Reconciliation completed and No provided to Patient/Care Livian Vanderbeck: Provided on Clinical Summary of Care: 04/19/2017 Form Type Recipient Paper Patient [facility] Electronic Signature(s) Signed: 04/20/2017 4:04:12 PM By: Roger Shelter Entered By: Roger Shelter on 04/19/2017 08:57:25 Depree, Gyanna A. ([id number]) -------------------------------------------------------------------------------- Lower Extremity Assessment Details Patient Name: Jane Bouche A. Date of Service: 04/19/2017 8:00 AM Medical Record Number: [id number] Patient Account Number: [id number] Date of Birth/Sex: 1950-09-14 (66 y.o. F) Treating RN: Montey Hora Primary Care Gustav Knueppel: Lavera Guise Other Clinician: Referring Shamell Suarez: Lavera Guise Treating Anthony Tamburo/Extender: Cathie Olden in  Treatment: 0 Electronic Signature(s) Signed: 04/19/2017 3:01:24 PM By: Montey Hora Entered By: Montey Hora on 04/19/2017 08:30:47 Schleicher, [name] ([id number]) -------------------------------------------------------------------------------- Multi Wound Chart Details Patient Name: Jane Bouche A. Date of Service: 04/19/2017 8:00 AM Medical Record Number: [id number] Patient Account Number: [id number] Date of Birth/Sex: 05-10-1950 (66 y.o. F) Treating RN: Ahmed Prima Primary Care Jarred Purtee: Lavera Guise Other Clinician: Referring Mattalyn Anderegg: Lavera Guise Treating Jionni Helming/Extender: Cathie Olden in Treatment: 0 Vital Signs Height(in): 69 Pulse(bpm): 60 Weight(lbs): 260 Blood Pressure(mmHg): 133/83 Body Mass Index(BMI): 38 Temperature(F): 98.3 Respiratory Rate 18 (breaths/min): Photos: [1:No Photos] [N/A:N/A] Wound Location: [1:Neck - Posterior] [N/A:N/A] Wounding Event: [1:Surgical Injury] [N/A:N/A] Primary Etiology: [1:Open Surgical Wound] [N/A:N/A] Comorbid History: [1:Osteoarthritis, Neuropathy] [N/A:N/A] Date Acquired: [1:03/26/2017] [N/A:N/A] Weeks of Treatment: [1:0] [N/A:N/A] Wound Status: [1:Open] [N/A:N/A] Measurements L x W x D [1:0.8x0.8x0.6] [N/A:N/A] (cm) Area (cm) : [1:0.503] [N/A:N/A] Volume (cm) : [1:0.302] [N/A:N/A] Classification: [1:Full Thickness Without Exposed Support Structures] [N/A:N/A] Exudate Amount: [1:Small] [N/A:N/A] Exudate Type: [1:Serous] [N/A:N/A] Exudate Color: [1:amber] [N/A:N/A] Wound Margin: [1:Flat and Intact] [N/A:N/A] Granulation Amount: [1:None Present (0%)] [N/A:N/A] Necrotic Amount: [1:Large (67-100%)] [N/A:N/A] Exposed Structures: [1:Fat Layer (Subcutaneous Tissue) Exposed: Yes Fascia: No Tendon: No Muscle: No Joint: No Bone: No] [N/A:N/A] Epithelialization: [1:Small (1-33%)] [N/A:N/A] Debridement: [1:Debridement - Excisional] [N/A:N/A] Pre-procedure [1:08:43] [N/A:N/A] Verification/Time Out Taken: Pain  Control: [1:Lidocaine 4% Topical Solution] [N/A:N/A] Tissue Debrided: [1:Subcutaneous, Slough] [N/A:N/A] Level: [1:Skin/Subcutaneous Tissue] [N/A:N/A] Debridement Area (sq cm): [1:0.64] [N/A:N/A] Instrument: [1:Curette] [N/A:N/A] Bleeding: [1:Minimum] [N/A:N/A] Hemostasis Achieved: Pressure N/A N/A Procedural Pain: 0 N/A N/A Post Procedural Pain: 0 N/A N/A Debridement Treatment Procedure was tolerated well N/A N/A Response: Post Debridement 0.8x0.8x0.7 N/A N/A Measurements L x W x D (cm) Post Debridement Volume: 0.352 N/A N/A (cm) Periwound Skin Texture: Excoriation: No N/A N/A Induration: No Callus: No Crepitus: No Rash: No Scarring: No Periwound Skin Moisture: Maceration: No N/A N/A Dry/Scaly: No Periwound Skin Color: Ecchymosis: Yes N/A N/A Atrophie Blanche: No Cyanosis: No Erythema: No Hemosiderin Staining: No Mottled: No Pallor: No Rubor: No Temperature: No Abnormality N/A N/A Tenderness on Palpation: No N/A N/A Wound Preparation: Ulcer Cleansing: N/A N/A Rinsed/Irrigated with Saline Topical Anesthetic Applied: Other: lidocaine 4% Procedures Performed: Debridement N/A N/A Treatment Notes Wound #1 (Posterior Neck) 2. Anesthetic Topical Lidocaine 4% cream to wound bed prior to debridement Other (specify in notes) 4. Dressing Applied: Santyl Ointment 5.  Secondary Dressing Applied Dry [place] Notes saline gauze over santyl then dry gauze Electronic Signature(s) Signed: 04/19/2017 10:36:59 AM By: Lawanda Cousins Entered By: Lawanda Cousins on 04/19/2017 10:36:58 [name] ([id number]) -------------------------------------------------------------------------------- [place] Details Patient Name: Jane Bouche A. Date of Service: 04/19/2017 8:00 AM Medical Record Number: [id number] Patient Account Number: [id number] Date of Birth/Sex: 02/15/1950 (66 y.o. F) Treating RN: Ahmed Prima Primary Care Clotee Schlicker: Lavera Guise  Other Clinician: Referring Geovannie Vilar: Lavera Guise Treating Onia Shiflett/Extender: Cathie Olden in Treatment: 0 Active Inactive ` Orientation to the Wound Care Program Nursing Diagnoses: Knowledge deficit related to the wound healing center program Goals: Patient/caregiver will verbalize understanding of the [facility] Program Date Initiated: 04/19/2017 Target Resolution Date: 05/19/2017 Goal Status: Active Interventions: Provide education on orientation to the wound center Notes: ` Wound/Skin Impairment Nursing Diagnoses: Impaired tissue integrity Knowledge deficit related to ulceration/compromised skin integrity Goals: Ulcer/skin breakdown will have a volume reduction of 80% by week 12 Date Initiated: 04/19/2017 Target Resolution Date: 08/11/2017 Goal Status: Active Interventions: Assess patient/caregiver ability to perform ulcer/skin care regimen upon admission and as needed Assess ulceration(s) every visit Notes: Electronic Signature(s) Signed: 04/23/2017 4:22:27 PM By: Alric Quan Entered By: Alric Quan on 04/19/2017 08:40:33 Genson, Florice A. ([id number]) -------------------------------------------------------------------------------- Pain Assessment Details Patient Name: Jane Bouche A. Date of Service: 04/19/2017 8:00 AM Medical Record Number: [id number] Patient Account Number: [id number] Date of Birth/Sex: 01-20-1950 (66 y.o. F) Treating RN: Montey Hora Primary Care Ricahrd Schwager: Lavera Guise Other Clinician: Referring Sayre Mazor: Lavera Guise Treating Jacai Kipp/Extender: Cathie Olden in Treatment: 0 Active Problems Location of Pain Severity and Description of Pain Patient Has Paino No Site Locations With Dressing Change: No Pain Management and Medication Current Pain Management: Notes Topical or injectable lidocaine is offered to patient for acute pain when surgical debridement is performed. If needed, Patient is instructed to use over  the counter pain medication for the following 24-48 hours after debridement. Wound care MDs do not prescribed pain medications. Patient has chronic pain or uncontrolled pain. Patient has been instructed to make an appointment with their Primary Care Physician for pain management. Electronic Signature(s) Signed: 04/19/2017 3:01:24 PM By: Montey Hora Entered By: Montey Hora on 04/19/2017 08:15:05 [name] ([id number]) -------------------------------------------------------------------------------- Patient/Caregiver Education Details Patient Name: Jane Bouche A. Date of Service: 04/19/2017 8:00 AM Medical Record Number: [id number] Patient Account Number: [id number] Date of Birth/Gender: 1950-05-03 (66 y.o. F) Treating RN: Roger Shelter Primary Care Physician: Lavera Guise Other Clinician: Referring Physician: Lavera Guise Treating Physician/Extender: Cathie Olden in Treatment: 0 Education Assessment Education Provided To: Patient and Caregiver Education Topics Provided Welcome To [facility]: Handouts: Welcome To [facility] Methods: Explain/Verbal Responses: State content correctly Wound Debridement: Handouts: Wound Debridement Methods: Explain/Verbal Responses: State content correctly Wound/Skin Impairment: Handouts: Caring for Your Ulcer Methods: Explain/Verbal Responses: State content correctly Electronic Signature(s) Signed: 04/20/2017 4:04:12 PM By: Roger Shelter Entered By: Roger Shelter on 04/19/2017 08:58:01 Presti, Brittley A. ([id number]) -------------------------------------------------------------------------------- Wound Assessment Details Patient Name: Jane Bouche A. Date of Service: 04/19/2017 8:00 AM Medical Record Number: [id number] Patient Account Number: [id number] Date of Birth/Sex: May 02, 1950 (66 y.o. F) Treating RN: Montey Hora Primary Care Nikola Marone: Lavera Guise Other Clinician: Referring Cayden Granholm: Lavera Guise Treating Marlies Ligman/Extender: Cathie Olden in Treatment: 0 Wound Status Wound Number: 1 Primary Etiology: Open Surgical Wound Wound Location: Neck - Posterior Wound Status: Open Wounding Event: Surgical Injury Comorbid History: Osteoarthritis, Neuropathy Date Acquired: 03/26/2017 Weeks Of Treatment: 0  Clustered Wound: No Photos Photo Uploaded By: Montey Hora on 04/19/2017 12:34:59 Wound Measurements Length: (cm) 0.8 Width: (cm) 0.8 Depth: (cm) 0.6 Area: (cm) 0.503 Volume: (cm) 0.302 % Reduction in Area: % Reduction in Volume: Epithelialization: Small (1-33%) Tunneling: No Undermining: No Wound Description Full Thickness Without Exposed Support Classification: Structures Wound Margin: Flat and Intact Exudate Small Amount: Exudate Type: Serous Exudate Color: amber Foul Odor After Cleansing: No Slough/Fibrino Yes Wound Bed Granulation Amount: None Present (0%) Exposed Structure Necrotic Amount: Large (67-100%) Fascia Exposed: No Necrotic Quality: Adherent Slough Fat Layer (Subcutaneous Tissue) Exposed: Yes Tendon Exposed: No Muscle Exposed: No Joint Exposed: No Bone Exposed: No Bradwell, Imajean A. ([id number]) Periwound Skin Texture Texture Color No Abnormalities Noted: No No Abnormalities Noted: No Callus: No Atrophie Blanche: No Crepitus: No Cyanosis: No Excoriation: No Ecchymosis: Yes Induration: No Erythema: No Rash: No Hemosiderin Staining: No Scarring: No Mottled: No Pallor: No Moisture Rubor: No No Abnormalities Noted: No Dry / Scaly: No Temperature / Pain Maceration: No Temperature: No Abnormality Wound Preparation Ulcer Cleansing: Rinsed/Irrigated with Saline Topical Anesthetic Applied: Other: lidocaine 4%, Electronic Signature(s) Signed: 04/19/2017 3:01:24 PM By: Montey Hora Entered By: Montey Hora on 04/19/2017 08:30:38 [name]  ([id number]) -------------------------------------------------------------------------------- Vitals Details Patient Name: Jane Bouche A. Date of Service: 04/19/2017 8:00 AM Medical Record Number: [id number] Patient Account Number: [id number] Date of Birth/Sex: March 16, 1950 (66 y.o. F) Treating RN: Montey Hora Primary Care Bryssa Tones: Lavera Guise Other Clinician: Referring Arthor Gorter: Lavera Guise Treating Ryen Rhames/Extender: Cathie Olden in Treatment: 0 Vital Signs Time Taken: 08:21 Temperature (F): 98.3 Height (in): 69 Pulse (bpm): 60 Source: Measured Respiratory Rate (breaths/min): 18 Weight (lbs): 260 Blood Pressure (mmHg): 133/83 Source: Measured Reference Range: 80 - 120 mg / dl Body Mass Index (BMI): 38.4 Electronic Signature(s) Signed: 04/19/2017 3:01:24 PM By: Montey Hora Entered By: Montey Hora on 04/19/2017 08:22:52

## 2017-04-30 ENCOUNTER — Ambulatory Visit
Admission: RE | Admit: 2017-04-30 | Discharge: 2017-04-30 | Disposition: A | Discharge status: Home / Self Care | Payer: Medicare Other | Source: Ambulatory Visit | Attending: Neurosurgery | Admitting: Neurosurgery

## 2017-04-30 ENCOUNTER — Ambulatory Visit: Payer: Medicare Other

## 2017-04-30 DIAGNOSIS — I2510 Atherosclerotic heart disease of native coronary artery without angina pectoris: Secondary | ICD-10-CM | POA: Diagnosis not present

## 2017-04-30 DIAGNOSIS — K5730 Diverticulosis of large intestine without perforation or abscess without bleeding: Secondary | ICD-10-CM | POA: Diagnosis not present

## 2017-04-30 DIAGNOSIS — G959 Disease of spinal cord, unspecified: Secondary | ICD-10-CM | POA: Diagnosis present

## 2017-04-30 DIAGNOSIS — G9589 Other specified diseases of spinal cord: Secondary | ICD-10-CM

## 2017-04-30 DIAGNOSIS — D480 Neoplasm of uncertain behavior of bone and articular cartilage: Secondary | ICD-10-CM | POA: Diagnosis present

## 2017-04-30 DIAGNOSIS — N281 Cyst of kidney, acquired: Secondary | ICD-10-CM | POA: Insufficient documentation

## 2017-04-30 DIAGNOSIS — I700 Atherosclerosis of aorta: Secondary | ICD-10-CM | POA: Diagnosis not present

## 2017-04-30 DIAGNOSIS — D259 Leiomyoma of uterus, unspecified: Secondary | ICD-10-CM | POA: Insufficient documentation

## 2017-04-30 MED ORDER — IOPAMIDOL (ISOVUE-300) INJECTION 61%
100.0000 mL | Freq: Once | INTRAVENOUS | Status: AC | PRN
  Administered 2017-04-30: 100 mL via INTRAVENOUS

## 2017-05-01 ENCOUNTER — Ambulatory Visit: Payer: Medicare Other

## 2017-05-01 NOTE — Progress Notes (Signed)
KEONNA, RAETHER ([id number]) Visit Report for 04/26/2017 Arrival Information Details Patient Name: DYAMON, SOSINSKI A. Date of Service: 04/26/2017 8:00 AM Medical Record Number: [id number] Patient Account Number: [id number] Date of Birth/Sex: 23-Jul-1950 (66 y.o. F) Treating RN: Montey Hora Primary Care Alfonsa Vaile: Lavera Guise Other Clinician: Referring Arline Ketter: Lavera Guise Treating Khori Rosevear/Extender: Cathie Olden in Treatment: 1 Visit Information History Since Last Visit Added or deleted any medications: No Patient Arrived: Ambulatory Any new allergies or adverse reactions: No Arrival Time: 08:03 Had a fall or experienced change in No Accompanied By: spouse activities of daily living that may affect Transfer Assistance: None risk of falls: Patient Identification Verified: Yes Signs or symptoms of abuse/neglect since last visito No Secondary Verification Process Completed: Yes Hospitalized since last visit: No Implantable device outside of the clinic excluding No cellular tissue based products placed in the center since last visit: Has Dressing in Place as Prescribed: Yes Pain Present Now: No Electronic Signature(s) Signed: 04/26/2017 5:09:22 PM By: Montey Hora Entered By: Montey Hora on 04/26/2017 08:09:16 [name] ([id number]) -------------------------------------------------------------------------------- Encounter Discharge Information Details Patient Name: [name] Date of Service: 04/26/2017 8:00 AM Medical Record Number: [id number] Patient Account Number: [id number] Date of Birth/Sex: [birth year] (66 y.o. F) Treating RN: Ahmed Prima Primary Care Adeola Dennen: Lavera Guise Other Clinician: Referring Deby Adger: Lavera Guise Treating Norene Oliveri/Extender: Cathie Olden in Treatment: 1 Encounter Discharge Information Items Discharge Pain Level: 0 Discharge Condition: Stable Ambulatory Status: Ambulatory Discharge Destination:  Home Transportation: Private Auto Accompanied By: husband Schedule Follow-up Appointment: Yes Medication Reconciliation completed and No provided to Patient/Care Dennard Vezina: Provided on Clinical Summary of Care: 04/26/2017 Form Type Recipient Paper Patient [facility] Electronic Signature(s) Signed: 04/26/2017 3:01:38 PM By: Roger Shelter Entered By: Roger Shelter on 04/26/2017 08:32:13 Rozier, Donica A. ([id number]) -------------------------------------------------------------------------------- Lower Extremity Assessment Details Patient Name: [name] Date of Service: 04/26/2017 8:00 AM Medical Record Number: [id number] Patient Account Number: [id number] Date of Birth/Sex: 1950/05/07 (66 y.o. F) Treating RN: Montey Hora Primary Care Abriella Filkins: Lavera Guise Other Clinician: Referring Lander Eslick: Lavera Guise Treating Jaimin Krupka/Extender: Cathie Olden in Treatment: 1 Electronic Signature(s) Signed: 04/26/2017 5:09:22 PM By: Montey Hora Entered By: Montey Hora on 04/26/2017 08:14:04 [name] ([id number]) -------------------------------------------------------------------------------- Multi Wound Chart Details Patient Name: [name] Date of Service: 04/26/2017 8:00 AM Medical Record Number: [id number] Patient Account Number: [id number] Date of Birth/Sex: 1950-10-02 (66 y.o. F) Treating RN: Ahmed Prima Primary Care Malikai Gut: Lavera Guise Other Clinician: Referring Hanako Tipping: Lavera Guise Treating Essica Kiker/Extender: Cathie Olden in Treatment: 1 Vital Signs Height(in): 69 Pulse(bpm): 55 Weight(lbs): 260 Blood Pressure(mmHg): 141/77 Body Mass Index(BMI): 38 Temperature(F): 98.4 Respiratory Rate 18 (breaths/min): Photos: [1:No Photos] [N/A:N/A] Wound Location: [1:Neck - Posterior] [N/A:N/A] Wounding Event: [1:Surgical Injury] [N/A:N/A] Primary Etiology: [1:Open Surgical Wound] [N/A:N/A] Comorbid History: [1:Osteoarthritis, Neuropathy]  [N/A:N/A] Date Acquired: [1:03/26/2017] [N/A:N/A] Weeks of Treatment: [1:1] [N/A:N/A] Wound Status: [1:Open] [N/A:N/A] Measurements L x W x D [1:0.5x0.5x0.5] [N/A:N/A] (cm) Area (cm) : [1:0.196] [N/A:N/A] Volume (cm) : [1:0.098] [N/A:N/A] % Reduction in Area: [1:61.00%] [N/A:N/A] % Reduction in Volume: [1:67.50%] [N/A:N/A] Classification: [1:Full Thickness Without Exposed Support Structures] [N/A:N/A] Exudate Amount: [1:Medium] [N/A:N/A] Exudate Type: [1:Serous] [N/A:N/A] Exudate Color: [1:amber] [N/A:N/A] Wound Margin: [1:Flat and Intact] [N/A:N/A] Granulation Amount: [1:None Present (0%)] [N/A:N/A] Necrotic Amount: [1:Large (67-100%)] [N/A:N/A] Exposed Structures: [1:Fat Layer (Subcutaneous Tissue) Exposed: Yes Fascia: No Tendon: No Muscle: No Joint: No Bone: No] [N/A:N/A] Epithelialization: [1:Small (1-33%)] [N/A:N/A] Debridement: [1:Debridement - Excisional] [N/A:N/A] Pre-procedure [1:08:19] [N/A:N/A] Verification/Time Out Taken: Pain Control: [1:Lidocaine  4% Topical Solution] [N/A:N/A] Tissue Debrided: [1:Subcutaneous, Slough] [N/A:N/A] Level: [1:Skin/Subcutaneous Tissue] [N/A:N/A] Debridement Area (sq cm): [1:0.25] [N/A:N/A] Instrument: Curette N/A N/A Bleeding: Minimum N/A N/A Hemostasis Achieved: Pressure N/A N/A Procedural Pain: 0 N/A N/A Post Procedural Pain: 0 N/A N/A Debridement Treatment Procedure was tolerated well N/A N/A Response: Post Debridement 0.5x0.5x0.6 N/A N/A Measurements L x W x D (cm) Post Debridement Volume: 0.118 N/A N/A (cm) Periwound Skin Texture: Excoriation: No N/A N/A Induration: No Callus: No Crepitus: No Rash: No Scarring: No Periwound Skin Moisture: Maceration: No N/A N/A Dry/Scaly: No Periwound Skin Color: Ecchymosis: Yes N/A N/A Atrophie Blanche: No Cyanosis: No Erythema: No Hemosiderin Staining: No Mottled: No Pallor: No Rubor: No Temperature: No Abnormality N/A N/A Tenderness on Palpation: No N/A N/A Wound  Preparation: Ulcer Cleansing: N/A N/A Rinsed/Irrigated with Saline Topical Anesthetic Applied: Other: lidocaine 4% Procedures Performed: Debridement N/A N/A Treatment Notes Electronic Signature(s) Signed: 04/26/2017 8:25:25 AM By: Lawanda Cousins Entered By: Lawanda Cousins on 04/26/2017 08:25:25 [name] ([id number]) -------------------------------------------------------------------------------- [place] Details Patient Name: [name] Date of Service: 04/26/2017 8:00 AM Medical Record Number: [id number] Patient Account Number: [id number] Date of Birth/Sex: 1950/04/07 (66 y.o. F) Treating RN: Ahmed Prima Primary Care Dwanda Tufano: Lavera Guise Other Clinician: Referring Brooklen Runquist: Lavera Guise Treating Gaye Scorza/Extender: Cathie Olden in Treatment: 1 Active Inactive ` Orientation to the Wound Care Program Nursing Diagnoses: Knowledge deficit related to the wound healing center program Goals: Patient/caregiver will verbalize understanding of the [facility] Program Date Initiated: 04/19/2017 Target Resolution Date: 05/19/2017 Goal Status: Active Interventions: Provide education on orientation to the wound center Notes: ` Wound/Skin Impairment Nursing Diagnoses: Impaired tissue integrity Knowledge deficit related to ulceration/compromised skin integrity Goals: Ulcer/skin breakdown will have a volume reduction of 80% by week 12 Date Initiated: 04/19/2017 Target Resolution Date: 08/11/2017 Goal Status: Active Interventions: Assess patient/caregiver ability to perform ulcer/skin care regimen upon admission and as needed Assess ulceration(s) every visit Notes: Electronic Signature(s) Signed: 04/26/2017 5:13:37 PM By: Alric Quan Entered By: Alric Quan on 04/26/2017 08:17:41 Lesage, Gisela. ([id number]) -------------------------------------------------------------------------------- Pain Assessment Details Patient Name:  [name] Date of Service: 04/26/2017 8:00 AM Medical Record Number: [id number] Patient Account Number: [id number] Date of Birth/Sex: July 04, 1950 (66 y.o. F) Treating RN: Montey Hora Primary Care Shatavia Santor: Lavera Guise Other Clinician: Referring Jerret Mcbane: Lavera Guise Treating Eeva Schlosser/Extender: Cathie Olden in Treatment: 1 Active Problems Location of Pain Severity and Description of Pain Patient Has Paino No Site Locations Pain Management and Medication Current Pain Management: Electronic Signature(s) Signed: 04/26/2017 5:09:22 PM By: Montey Hora Entered By: Montey Hora on 04/26/2017 08:09:28 [name] ([id number]) -------------------------------------------------------------------------------- Patient/Caregiver Education Details Patient Name: [name] Date of Service: 04/26/2017 8:00 AM Medical Record Number: [id number] Patient Account Number: [id number] Date of Birth/Gender: 1950/10/28 (66 y.o. F) Treating RN: Roger Shelter Primary Care Physician: Lavera Guise Other Clinician: Referring Physician: Lavera Guise Treating Physician/Extender: Cathie Olden in Treatment: 1 Education Assessment Education Provided To: Patient and Caregiver Education Topics Provided Wound/Skin Impairment: Handouts: Caring for Your Ulcer Methods: Explain/Verbal Responses: State content correctly Electronic Signature(s) Signed: 04/26/2017 3:01:38 PM By: Roger Shelter Entered By: Roger Shelter on 04/26/2017 08:32:28 [name] ([id number]) -------------------------------------------------------------------------------- Wound Assessment Details Patient Name: [name] Date of Service: 04/26/2017 8:00 AM Medical Record Number: [id number] Patient Account Number: [id number] Date of Birth/Sex: 12-Nov-1950 (66 y.o. F) Treating RN: Montey Hora Primary Care Ariela Mochizuki: Lavera Guise Other Clinician: Referring Alvia Jablonski: Lavera Guise Treating  Jeral Zick/Extender: Lawanda Cousins  Weeks in Treatment: 1 Wound Status Wound Number: 1 Primary Etiology: Open Surgical Wound Wound Location: Neck - Posterior Wound Status: Open Wounding Event: Surgical Injury Comorbid History: Osteoarthritis, Neuropathy Date Acquired: 03/26/2017 Weeks Of Treatment: 1 Clustered Wound: No Photos Photo Uploaded By: Montey Hora on 04/26/2017 08:54:30 Wound Measurements Length: (cm) 0.5 Width: (cm) 0.5 Depth: (cm) 0.5 Area: (cm) 0.196 Volume: (cm) 0.098 % Reduction in Area: 61% % Reduction in Volume: 67.5% Epithelialization: Small (1-33%) Tunneling: No Undermining: No Wound Description Full Thickness Without Exposed Support Classification: Structures Wound Margin: Flat and Intact Exudate Medium Amount: Exudate Type: Serous Exudate Color: amber Foul Odor After Cleansing: No Slough/Fibrino Yes Wound Bed Granulation Amount: None Present (0%) Exposed Structure Necrotic Amount: Large (67-100%) Fascia Exposed: No Necrotic Quality: Adherent Slough Fat Layer (Subcutaneous Tissue) Exposed: Yes Tendon Exposed: No Muscle Exposed: No Joint Exposed: No Bone Exposed: No Colombe, Lessly A. ([id number]) Periwound Skin Texture Texture Color No Abnormalities Noted: No No Abnormalities Noted: No Callus: No Atrophie Blanche: No Crepitus: No Cyanosis: No Excoriation: No Ecchymosis: Yes Induration: No Erythema: No Rash: No Hemosiderin Staining: No Scarring: No Mottled: No Pallor: No Moisture Rubor: No No Abnormalities Noted: No Dry / Scaly: No Temperature / Pain Maceration: No Temperature: No Abnormality Wound Preparation Ulcer Cleansing: Rinsed/Irrigated with Saline Topical Anesthetic Applied: Other: lidocaine 4%, Treatment Notes Wound #1 (Posterior Neck) 1. Cleansed with: Clean wound with Normal Saline 2. Anesthetic Topical Lidocaine 4% cream to wound bed prior to debridement 4. Dressing Applied: Santyl Ointment 5. Secondary  [place] Notes saline gauze over santyl then dry gauze Electronic Signature(s) Signed: 04/26/2017 5:09:22 PM By: Montey Hora Entered By: Montey Hora on 04/26/2017 08:13:57 [name] ([id number]) -------------------------------------------------------------------------------- [place] Details Patient Name: [name] Date of Service: 04/26/2017 8:00 AM Medical Record Number: [id number] Patient Account Number: [id number] Date of Birth/Sex: Feb 07, 1950 (66 y.o. F) Treating RN: Montey Hora Primary Care Malayla Granberry: Lavera Guise Other Clinician: Referring Kaylia Winborne: Lavera Guise Treating Mckaylee Dimalanta/Extender: Cathie Olden in Treatment: 1 Vital Signs Time Taken: 08:08 Temperature (F): 98.4 Height (in): 69 Pulse (bpm): 82 Weight (lbs): 260 Respiratory Rate (breaths/min): 18 Body Mass Index (BMI): 38.4 Blood Pressure (mmHg): 141/77 Reference Range: 80 - 120 mg / dl Electronic Signature(s) Signed: 04/26/2017 5:09:22 PM By: Montey Hora Entered By: Montey Hora on 04/26/2017 08:10:03

## 2017-05-01 NOTE — Progress Notes (Signed)
Jane [name] ([id number]) Visit Report for 04/26/2017 Chief Complaint Document Details Patient Name: Jane [name], Jane [name]. Date of Service: 04/26/2017 8:00 AM Medical Record Number: [id number] Patient Account Number: [id number] Date of Birth/Sex: Jun 20, 1950 (66 y.o. F) Treating RN: Ahmed Prima Primary Care Provider: Lavera Guise Other Clinician: Referring Provider: Lavera Guise Treating Provider/Extender: Cathie Olden in Treatment: 1 Information Obtained from: Patient Chief Complaint She is here for evaluation of [name] cervical neck surgical site dehiscence Electronic Signature(s) Signed: 04/26/2017 8:25:55 AM By: Lawanda Cousins Entered By: Lawanda Cousins on 04/26/2017 08:25:55 Joaquin, [name] ([id number]) -------------------------------------------------------------------------------- Debridement Details Patient Name: Jane [name]. Date of Service: 04/26/2017 8:00 AM Medical Record Number: [id number] Patient Account Number: [id number] Date of Birth/Sex: [birth year] (66 y.o. F) Treating RN: Ahmed Prima Primary Care Provider: Lavera Guise Other Clinician: Referring Provider: Lavera Guise Treating Provider/Extender: Cathie Olden in Treatment: 1 Debridement Performed for Wound #1 Posterior Neck Assessment: Performed By: Physician Lawanda Cousins, NP Debridement Type: Debridement Pre-procedure Verification/Time Yes - 08:19 Out Taken: Start Time: 08:19 Pain Control: Lidocaine 4% Topical Solution Total Area Debrided (L x W): 0.5 (cm) x 0.5 (cm) = 0.25 (cm) Tissue and other material Non-Viable, Slough, Fibrin/Exudate, Slough debrided: Level: Non-Viable Tissue Debridement Description: Selective/Open Wound Instrument: Curette Bleeding: Minimum Hemostasis Achieved: Pressure End Time: 08:22 Procedural Pain: 0 Post Procedural Pain: 0 Response to Treatment: Procedure was tolerated well Post Debridement Measurements of Total Wound Length: (cm) 0.5 Width: (cm)  0.5 Depth: (cm) 0.6 Volume: (cm) 0.118 Character of Wound/Ulcer Post Debridement: Requires Further Debridement Post Procedure Diagnosis Same as Pre-procedure Electronic Signature(s) Signed: 04/26/2017 8:25:46 AM By: Lawanda Cousins Signed: 04/26/2017 5:13:37 PM By: Alric Quan Entered By: Lawanda Cousins on 04/26/2017 08:25:46 Litaker, [name]. ([id number]) -------------------------------------------------------------------------------- HPI Details Patient Name: Jane [name]. Date of Service: 04/26/2017 8:00 AM Medical Record Number: [id number] Patient Account Number: [id number] Date of Birth/Sex: 1950-06-17 (66 y.o. F) Treating RN: Ahmed Prima Primary Care Provider: Lavera Guise Other Clinician: Referring Provider: Lavera Guise Treating Provider/Extender: Cathie Olden in Treatment: 1 History of Present Illness HPI Description: 04/19/17-She is here for evaluation of [name] cervical neck wound dehiscence. 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 Sutures were removed on 4/4 there is no disc noted concerned and the crevice of the posterior cervical spine. She'll follow-up appointment on 4/9 where she was prescribed Augmentin for 2 weeks and consult to wound clinic. She presents with limited range of motion to her neck which is not new postoperatively. There is [name] small area of nonviable tissue and [name] natural crevice in her posterior neck. We will treat with santyl, they are to call if unable to afford. She will follow up next week. 04/26/17- She is here in follow-up evaluation for cervical neck wound. She has an appointment with Dr. Cari Caraway next Thursday. She states that the Augmentin prescribed by the surgeon on 4/9 was supposed to be for 2 weeks but they were only provided with enough medication to take for 1 week; they have been encouraged to contact the surgeon's office regarding this discrepancy. They were able to get  the Santyl but there was [name] delay at the pharmacy they were unable to pick it up until Monday. There is improvement in the slough consistency, otherwise no significant change. She will continue the same treatment plan and follow up next week. Electronic Signature(s) Signed: 04/26/2017 8:28:58 AM By: Lawanda Cousins Entered By: Lawanda Cousins on 04/26/2017 08:28:58 [name],  [name]. ([id number]) -------------------------------------------------------------------------------- Physician Orders Details Patient Name: Jane [name]. Date of Service: 04/26/2017 8:00 AM Medical Record Number: [id number] Patient Account Number: [id number] Date of Birth/Sex: 12/05/1950 (66 y.o. F) Treating RN: Ahmed Prima Primary Care Provider: Lavera Guise Other Clinician: Referring Provider: Lavera Guise Treating Provider/Extender: Cathie Olden in Treatment: 1 Verbal / Phone Orders: Yes Clinician: Pinkerton, Debi Read Back and Verified: Yes Diagnosis Coding Wound Cleansing Wound #1 Posterior Neck o Clean wound with Normal Saline. o Cleanse wound with mild soap and water Anesthetic (add to Medication List) Wound #1 Posterior Neck o Topical Lidocaine 4% cream applied to wound bed prior to debridement (In Clinic Only). Skin Barriers/Peri-Wound Care Wound #1 Posterior Neck o Skin Prep Primary Wound Dressing Wound #1 Posterior Neck o Saline moistened gauze o Santyl Ointment Secondary Dressing Wound #1 Posterior Neck o Dry Gauze o Boardered Foam Dressing - or telfa island Dressing Change Frequency Wound #1 Posterior Neck o Change dressing every day. Follow-up Appointments Wound #1 Posterior Neck o Return Appointment in 1 week. Additional Orders / Instructions Wound #1 Posterior Neck o Increase protein intake. o Other: - multivitamin Patient Medications Allergies: No Known Allergies Notifications Medication Indication Start End lidocaine Sizer, Jane [name].  ([id number]) Notifications Medication Indication Start End DOSE 1 - topical 4 % cream - 1 cream topical Electronic Signature(s) Signed: 04/26/2017 3:41:30 PM By: Lawanda Cousins Signed: 04/26/2017 5:13:37 PM By: Alric Quan Entered By: Alric Quan on 04/26/2017 08:22:18 [name] ([id number]) -------------------------------------------------------------------------------- Prescription 04/26/2017 Patient Name: Jane [name]. Provider: Lawanda Cousins NP Date of Birth: 1950/03/03 NPI#: [id number] Sex: F DEA#: [id number] Phone #: [phone] License #: Patient Address: [facility] [place], [place] [address] [address], [place], [place] [address] [phone] Allergies No Known Allergies Medication Medication: Route: Strength: Form: lidocaine topical 4% cream Class: TOPICAL LOCAL ANESTHETICS Dose: Frequency / Time: Indication: 1 1 cream topical Number of Refills: Number of Units: 0 Generic Substitution: Start Date: End Date: Administered at [place]: Yes Time Administered: Time Discontinued: Note to Pharmacy: Signature(s): Date(s): Electronic Signature(s) Signed: 04/26/2017 3:41:30 PM By: Lawanda Cousins Signed: 04/26/2017 5:13:37 PM By: Alric Quan Entered By: Alric Quan on 04/26/2017 08:22:18 [name] ([id number]) Steadham, Jaylia [name]. ([id number]) --------------------------------------------------------------------------------  Problem List Details Patient Name: Jane [name]. Date of Service: 04/26/2017 8:00 AM Medical Record Number: [id number] Patient Account Number: [id number] Date of Birth/Sex: January 13, 1950 (66 y.o. 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 ([id number]) -------------------------------------------------------------------------------- Progress Note Details Patient Name: Jane [name]. Date of Service: 04/26/2017 8:00 AM Medical Record Number: [id number] Patient Account Number: [id number] Date of Birth/Sex: 09-14-1950 (66 y.o. F) Treating RN: Ahmed Prima Primary Care Provider: Lavera Guise Other Clinician: Referring Provider: Lavera Guise Treating Provider/Extender: Cathie Olden in Treatment: 1 Subjective Chief Complaint Information obtained from Patient She is here for evaluation of [name] cervical neck surgical site dehiscence History of Present Illness (HPI) 04/19/17-She is here  for evaluation of [name] cervical neck wound dehiscence. 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. Sutures were removed on 4/4 there is no disc noted concerned and the crevice of the posterior cervical spine. She'll follow-up appointment on 4/9 where she was prescribed Augmentin for 2 weeks and consult to wound clinic. She presents with limited range of motion to her neck which is not new postoperatively. There is [name] small area of nonviable tissue and [name] natural crevice in her posterior neck. We will treat with santyl, they are to call if unable to afford. She will follow up next week. 04/26/17- She is here in follow-up evaluation for cervical neck wound. She has an appointment with Dr. Cari Caraway  next Thursday. She states that the Augmentin prescribed by the surgeon on 4/9 was supposed to be for 2 weeks but they were only provided with enough medication to take for 1 week; they have been encouraged to contact the surgeon's office regarding this discrepancy. They were able to get the Santyl but there was [name] delay at the pharmacy they were unable to pick it up until Monday. There is improvement in the slough consistency, otherwise no significant change. She will continue the same treatment plan and follow up next week. Patient History Information obtained from Patient. Family History Lung Disease - Father, No family history of Cancer, Diabetes, Heart Disease, Hereditary Spherocytosis, Hypertension, Kidney Disease, Seizures, Stroke, Thyroid Problems, Tuberculosis. Social History Never smoker, Marital Status - Married, Alcohol Use - Rarely, Drug Use - No History, Caffeine Use - Daily. Medical And Surgical History Notes Musculoskeletal spinal cord mass removed and screws inserted March 26 2017 Objective Riles, Aasiya [name]. ([id number]) Constitutional Vitals Time Taken: 8:08 AM, Height: 69 in, Weight: 260 lbs, BMI: 38.4, Temperature: 98.4 F, Pulse: 82 bpm, Respiratory Rate: 18 breaths/min, Blood Pressure: 141/77 mmHg. Integumentary (Hair, Skin) Wound #1 status is Open. Original cause of wound was Surgical Injury. The wound is located on the Posterior Neck. The wound measures 0.5cm length x 0.5cm width x 0.5cm depth; 0.196cm^2 area and 0.098cm^3 volume. There is Fat Layer (Subcutaneous Tissue) Exposed exposed. There is no tunneling or undermining noted. There is [name] medium amount of serous drainage noted. The wound margin is flat and intact. There is no granulation within the wound bed. There is [name] large (67-100%) amount of necrotic tissue within the wound bed including Adherent Slough. The periwound skin appearance exhibited: Ecchymosis. The periwound skin appearance did not exhibit: Callus,  Crepitus, Excoriation, Induration, Rash, Scarring, Dry/Scaly, Maceration, Atrophie Blanche, Cyanosis, Hemosiderin Staining, Mottled, Pallor, Rubor, Erythema. Periwound temperature was noted as No Abnormality. 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 There was [name] Selective/Open Wound Non-Viable Tissue Debridement with [name] total area of 0.25 sq cm performed by Lawanda Cousins, NP. With the following instrument(s): Curette. to remove Non-Viable tissue/material Material removed includes and Slough, [place] after achieving pain control using Lidocaine 4% Topical Solution. No specimens were taken. [name] time out was conducted at 08:19, prior to the start of the procedure. [name] Minimum amount of bleeding was controlled with Pressure. The procedure was tolerated well with [name] pain level of 0 throughout and [name] pain level of  0 following the procedure. Post Debridement Measurements: 0.5cm length x 0.5cm width x 0.6cm depth; 0.118cm^3 volume. Character of Wound/Ulcer Post Debridement requires further debridement. Post procedure Diagnosis Wound #1: Same as Pre-Procedure Plan Wound Cleansing: Wound #1 Posterior Neck: Clean wound with Normal Saline. Cleanse wound with mild soap and water Anesthetic (add to Medication List): Graybill, Leanza [name]. ([id number]) Wound #1 Posterior Neck: Topical Lidocaine 4% cream applied to wound bed prior to debridement (In Clinic Only). Skin Barriers/Peri-Wound Care: Wound #1 Posterior Neck: Skin Prep Primary Wound Dressing: Wound #1 Posterior Neck: Saline moistened gauze Santyl Ointment Secondary Dressing: Wound #1 Posterior Neck: Dry Gauze Boardered Foam Dressing - or  telfa island Dressing Change Frequency: Wound #1 Posterior Neck: Change dressing every day. Follow-up Appointments: Wound #1 Posterior Neck: Return Appointment in 1 week. Additional Orders / Instructions: Wound #1 Posterior Neck: Increase protein intake. Other: - multivitamin The following medication(s) was prescribed: lidocaine topical 4 % cream 1 1 cream topical was prescribed at facility Electronic Signature(s) Signed: 04/26/2017 8:29:27 AM By: Lawanda Cousins Entered By: Lawanda Cousins on 04/26/2017 08:29:26 [name] ([id number]) -------------------------------------------------------------------------------- ROS/PFSH Details Patient Name: Jane [name]. Date of Service: 04/26/2017 8:00 AM Medical Record Number: [id number] Patient Account Number: [id number] Date of Birth/Sex: Jan 21, 1950 (66 y.o. F) Treating RN: Ahmed Prima Primary Care Provider: Lavera Guise Other Clinician: Referring Provider: Lavera Guise Treating Provider/Extender: Cathie Olden in Treatment: 1 Information Obtained From Patient Wound History Do you currently have one or more open woundso Yes How many open wounds do you currently haveo 1 Approximately how long have you had your woundso 3 weeks How have you been treating your wound(s) until nowo open to air Has your wound(s) ever healed and then re-openedo No Have you had any lab work done in the past montho No Have you tested positive for an antibiotic resistant organism (MRSA, VRE)o No Have you tested positive for osteomyelitis (bone infection)o No Have you had any tests for circulation on your legso No Hematologic/Lymphatic Medical History: Negative for: Anemia; Hemophilia; Human Immunodeficiency Virus; Lymphedema; Sickle Cell Disease Respiratory Medical History: Negative for: Aspiration; Asthma; Chronic Obstructive Pulmonary Disease (COPD); Pneumothorax; Sleep Apnea; Tuberculosis Cardiovascular Medical History: Negative for:  Angina; Arrhythmia; Congestive Heart Failure; Coronary Artery Disease; Deep Vein Thrombosis; Hypertension; Hypotension; Myocardial Infarction; Peripheral Arterial Disease; Peripheral Venous Disease; Phlebitis; Vasculitis Gastrointestinal Medical History: Negative for: Cirrhosis ; Colitis; Crohnos; Hepatitis [name]; Hepatitis B; Hepatitis C Endocrine Medical History: Negative for: Type I Diabetes; Type II Diabetes Immunological Medical History: Negative for: Lupus Erythematosus; Raynaudos; Scleroderma Musculoskeletal Medical History: Positive for: Osteoarthritis Bergren, Sheriden [name]. ([id number]) Negative for: Gout; Rheumatoid Arthritis; Osteomyelitis Past Medical History Notes: spinal cord mass removed and screws inserted March 26 2017 Neurologic Medical History: Positive for: Neuropathy - right hand Negative for: Dementia; Quadriplegia; Paraplegia; Seizure Disorder Oncologic Medical History: Negative for: Received Chemotherapy; Received Radiation Immunizations Pneumococcal Vaccine: Received Pneumococcal Vaccination: Yes Immunization Notes: up to date Implantable Devices Family and Social History Cancer: No; Diabetes: No; Heart Disease: No; Hereditary Spherocytosis: No; Hypertension: No; Kidney Disease: No; Lung Disease: Yes - Father; Seizures: No; Stroke: No; Thyroid Problems: No; Tuberculosis: No; Never smoker; Marital Status - Married; Alcohol Use: Rarely; Drug Use: No History; Caffeine Use: Daily; Financial Concerns: No; Food, Clothing or Shelter Needs: No; Support System Lacking: No; Transportation Concerns: No; Advanced Directives: No; Patient does not want information on Advanced Directives Physician Affirmation I have reviewed and agree with the above information. Electronic Signature(s) Signed: 04/26/2017 3:41:30 PM By: Rene Kocher,  Letonia Stead Signed: 04/26/2017 5:13:37 PM By: Alric Quan Entered By: Lawanda Cousins on 04/26/2017 08:29:10 [name]  ([id number]) -------------------------------------------------------------------------------- SuperBill Details Patient Name: Jane [name]. Date of Service: 04/26/2017 Medical Record Number: [id number] Patient Account Number: [id number] Date of Birth/Sex: April 03, 1950 (67 y.o. 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 G95.9 Disease of spinal cord, unspecified S11.80XS Unspecified open wound of other specified part of neck, sequela Facility Procedures CPT4 Code: [id number] Description: 36438 - DEBRIDE WOUND 1ST 20 SQ CM OR < ICD-10 Diagnosis Description S11.80XS Unspecified open wound of other specified part of neck, se Modifier: quela Quantity: 1 Physician Procedures CPT4 Code: [id number] Description: [place] - WC PHYS DEBR WO ANESTH 20 SQ CM ICD-10 Diagnosis Description S11.80XS Unspecified open wound of other specified part of neck, se Modifier: quela Quantity: 1 Electronic Signature(s) Signed: 04/26/2017 8:29:38 AM By: Lawanda Cousins Entered By: Lawanda Cousins on 04/26/2017 08:29:38

## 2017-05-03 ENCOUNTER — Encounter: Payer: Medicare Other | Admitting: Nurse Practitioner

## 2017-05-03 DIAGNOSIS — T8131XA Disruption of external operation (surgical) wound, not elsewhere classified, initial encounter: Secondary | ICD-10-CM | POA: Diagnosis not present

## 2017-05-08 NOTE — Progress Notes (Signed)
Jane [name], Jane [name] ([id number]) Visit Report for 05/03/2017 Chief Complaint Document Details Patient Name: Jane [name], [name] Date of Service: 05/03/2017 1:30 PM Medical Record Number: [id number] Patient Account Number: [id number] Date of Birth/Sex: 01-15-1950 (67 y.o. F) Treating RN: Ahmed Prima Primary Care Provider: Lavera Guise Other Clinician: Referring Provider: Lavera Guise Treating Provider/Extender: Cathie Olden in Treatment: 2 Information Obtained from: Patient Chief Complaint She is here for evaluation of a cervical neck surgical site dehiscence Electronic Signature(s) Signed: 05/03/2017 2:17:22 PM By: Lawanda Cousins Entered By: Lawanda Cousins on 05/03/2017 14:17:21 [name], [name] ([id number]) -------------------------------------------------------------------------------- Debridement Details Patient Name: Jane Bouche A. Date of Service: 05/03/2017 1:30 PM Medical Record Number: [id number] Patient Account Number: [id number] Date of Birth/Sex: Sep 12, 1950 (67 y.o. F) Treating RN: Ahmed Prima Primary Care Provider: Lavera Guise Other Clinician: Referring Provider: Lavera Guise Treating Provider/Extender: Cathie Olden in Treatment: 2 Debridement Performed for Wound #1 Posterior Neck Assessment: Performed By: Physician Lawanda Cousins, NP Debridement Type: Debridement Pre-procedure Verification/Time Yes - 13:58 Out Taken: Start Time: 13:58 Pain Control: Lidocaine 4% Topical Solution Total Area Debrided (L x W): 0.7 (cm) x 0.5 (cm) = 0.35 (cm) Tissue and other material Viable, Non-Viable, Slough, Subcutaneous, Fibrin/Exudate, Slough debrided: Level: Skin/Subcutaneous Tissue Debridement Description: Excisional Instrument: Curette Bleeding: Minimum Hemostasis Achieved: Pressure End Time: 14:00 Procedural Pain: 0 Post Procedural Pain: 0 Response to Treatment: Procedure was tolerated well Post Debridement Measurements of Total Wound Length: (cm)  0.7 Width: (cm) 0.5 Depth: (cm) 0.6 Volume: (cm) 0.165 Character of Wound/Ulcer Post Debridement: Requires Further Debridement Post Procedure Diagnosis Same as Pre-procedure Electronic Signature(s) Signed: 05/03/2017 5:04:33 PM By: Lawanda Cousins Signed: 05/04/2017 4:29:16 PM By: Alric Quan Entered By: Alric Quan on 05/03/2017 14:00:36 [name], [name] -------------------------------------------------------------------------------- HPI Details Patient Name: Jane Bouche A. Date of Service: 05/03/2017 1:30 PM Medical Record Number: [id number] Patient Account Number: [id number] Date of Birth/Sex: May 28, 1950 (67 y.o. F) Treating RN: Ahmed Prima Primary Care Provider: Lavera Guise Other Clinician: Referring Provider: Lavera Guise Treating Provider/Extender: Cathie Olden in Treatment: 2 History of Present Illness HPI Description: 04/19/17-She is here for evaluation of a cervical neck wound dehiscence. 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 Sutures were removed on 4/4 there is no disc noted concerned and the crevice of the posterior cervical spine. She'll follow-up appointment on 4/9 where she was prescribed Augmentin for 2 weeks and consult to wound clinic. She presents with limited range of motion to her neck which is not new postoperatively. There is a small area of nonviable tissue and a natural crevice in her posterior neck. We will treat with santyl, they are to call if unable to afford. She will follow up next week. 04/26/17- She is here in follow-up evaluation for cervical neck wound. She has an appointment with Dr. Cari Caraway next Thursday. She states that the Augmentin prescribed by the surgeon on 4/9 was supposed to be for 2 weeks but they were only provided with enough medication to take for 1 week; they have been encouraged to contact the surgeon's office regarding this discrepancy. They  were able to get the Santyl but there was a delay at the pharmacy they were unable to pick it up until Monday. There is improvement in the slough consistency, otherwise no significant change. She will continue the same treatment plan and follow up next week. 05/03/17-she is here in follow-up evaluation for cervical neck wound. She has an appointment with neurosurgery  after this appointment. She completed her Augmentin today. There is new visible areas of depth to the most proximal and distal aspect. We will continue with Santyl for 1-2 weeks and anticipate transitioning to collagen product at that time. Electronic Signature(s) Signed: 05/03/2017 2:18:33 PM By: Lawanda Cousins Entered By: Lawanda Cousins on 05/03/2017 14:18:33 Jane [name] ([id number]) -------------------------------------------------------------------------------- Physician Orders Details Patient Name: Jane Bouche A. Date of Service: 05/03/2017 1:30 PM Medical Record Number: [id number] Patient Account Number: [id number] Date of Birth/Sex: Jan 01, 1951 (67 y.o. F) Treating RN: Ahmed Prima Primary Care Provider: Lavera Guise Other Clinician: Referring Provider: Lavera Guise Treating Provider/Extender: Cathie Olden in Treatment: 2 Verbal / Phone Orders: Yes Clinician: Pinkerton, Debi Read Back and Verified: Yes Diagnosis Coding Wound Cleansing Wound #1 Posterior Neck o Clean wound with Normal Saline. o Cleanse wound with mild soap and water Anesthetic (add to Medication List) Wound #1 Posterior Neck o Topical Lidocaine 4% cream applied to wound bed prior to debridement (In Clinic Only). Skin Barriers/Peri-Wound Care Wound #1 Posterior Neck o Skin Prep Primary Wound Dressing Wound #1 Posterior Neck o Saline moistened gauze o Santyl Ointment Secondary Dressing Wound #1 Posterior Neck o Dry Gauze o Boardered Foam Dressing - or telfa island Dressing Change Frequency Wound #1 Posterior  Neck o Change dressing every day. Follow-up Appointments Wound #1 Posterior Neck o Return Appointment in 1 week. Additional Orders / Instructions Wound #1 Posterior Neck o Increase protein intake. o Other: - multivitamin Patient Medications Allergies: No Known Allergies Notifications Medication Indication Start End lidocaine [name], [name] ([id number]) Notifications Medication Indication Start End DOSE 1 - topical 4 % cream - 1 cream topical Electronic Signature(s) Signed: 05/03/2017 5:04:33 PM By: Lawanda Cousins Signed: 05/04/2017 4:29:16 PM By: Alric Quan Entered By: Alric Quan on 05/03/2017 14:00:58 Jane [name] ([id number]) -------------------------------------------------------------------------------- Prescription 05/03/2017 Patient Name: Jane Bouche A. Provider: Lawanda Cousins NP Date of Birth: 09/28/1950 NPI#: [id number] Sex: F DEA#: [id number] Phone #: [phone] License #: Patient Address: [facility] [place], [place] [address] [address] [place], [place] [address] [phone] Allergies No Known Allergies Medication Medication: Route: Strength: Form: lidocaine 4 % topical cream topical 4% cream Class: TOPICAL LOCAL ANESTHETICS Dose: Frequency / Time: Indication: 1 1 cream topical Number of Refills: Number of Units: 0 Generic Substitution: Start Date: End Date: One Time Use: Substitution Permitted No Note to Pharmacy: Signature(s): Date(s): Electronic Signature(s) Signed: 05/03/2017 5:04:33 PM By: Lawanda Cousins Signed: 05/04/2017 4:29:16 PM By: Alric Quan Entered By: Alric Quan on 05/03/2017 14:00:59 [name], [name] ([id number]) --------------------------------------------------------------------------------  Problem List Details Patient Name: Jane Bouche A. Date of Service: 05/03/2017 1:30 PM Medical Record  Number: [id number] Patient Account Number: [id number] Date of Birth/Sex: May 01, 1950 (66 y.o. 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 ([id number]) -------------------------------------------------------------------------------- Progress Note Details Patient Name: Jane Bouche A. Date of Service: 05/03/2017 1:30 PM Medical Record Number: [id number] Patient Account Number: [id number] Date of Birth/Sex: 02/05/1950 (66  y.o. F) Treating RN: Ahmed Prima Primary Care Provider: Lavera Guise Other Clinician: Referring Provider: Lavera Guise Treating Provider/Extender: Cathie Olden in Treatment: 2 Subjective Chief Complaint Information obtained from Patient She is here for evaluation of a cervical neck surgical site dehiscence History of Present Illness (HPI) 04/19/17-She is here for evaluation of a cervical neck wound dehiscence. 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 Sutures were removed on 4/4 there is no disc noted concerned and the crevice of the posterior cervical spine. She'll follow-up appointment on 4/9 where she was prescribed Augmentin for 2 weeks and consult to wound clinic. She presents with limited range of motion to her neck which is not new postoperatively. There is a small area  of nonviable tissue and a natural crevice in her posterior neck. We will treat with santyl, they are to call if unable to afford. She will follow up next week. 04/26/17- She is here in follow-up evaluation for cervical neck wound. She has an appointment with Dr. Cari Caraway next Thursday. She states that the Augmentin prescribed by the surgeon on 4/9 was supposed to be for 2 weeks but they were only provided with enough medication to take for 1 week; they have been encouraged to contact the surgeon's office regarding this discrepancy. They were able to get the Santyl but there was a delay at the pharmacy they were unable to pick it up until Monday. There is improvement in the slough consistency, otherwise no significant change. She will continue the same treatment plan and follow up next week. 05/03/17-she is here in follow-up evaluation for cervical neck wound. She has an appointment with neurosurgery after this appointment. She completed her Augmentin today. There is new visible areas of depth to the most proximal and distal aspect. We will continue with Santyl for 1-2 weeks and anticipate transitioning to collagen product at that time. Patient History Information obtained from Patient. Family History Lung Disease - Father, No family history of Cancer, Diabetes, Heart Disease, Hereditary Spherocytosis, Hypertension, Kidney Disease, Seizures, Stroke, Thyroid Problems, Tuberculosis. Social History Never smoker, Marital Status - Married, Alcohol Use - Rarely, Drug Use - No History, Caffeine Use - Daily. Medical And Surgical History Notes Musculoskeletal spinal cord mass removed and screws inserted March 26 2017 ALLANNAH, KEMPEN A. ([id number]) Objective Constitutional Vitals Time Taken: 1:48 PM, Height: 69 in, Weight: 260 lbs, BMI: 38.4, Temperature: 98.4 F, Pulse: 92 bpm, Respiratory Rate: 16 breaths/min, Blood Pressure: 130/81 mmHg. Integumentary (Hair, Skin) Wound #1 status is Open. Original  cause of wound was Surgical Injury. The wound is located on the Posterior Neck. The wound measures 0.7cm length x 0.5cm width x 0.5cm depth; 0.275cm^2 area and 0.137cm^3 volume. There is Fat Layer (Subcutaneous Tissue) Exposed exposed. There is no tunneling or undermining noted. There is a medium amount of serous drainage noted. The wound margin is flat and intact. There is no granulation within the wound bed. There is a large (67-100%) amount of necrotic tissue within the wound bed including Adherent Slough. The periwound skin appearance exhibited: Ecchymosis. The periwound skin appearance did not exhibit: Callus, Crepitus, Excoriation, Induration, Rash, Scarring, Dry/Scaly, Maceration, Atrophie Blanche, Cyanosis, Hemosiderin Staining, Mottled, Pallor, Rubor, Erythema. Periwound temperature was noted as No Abnormality. 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 There was a Excisional Skin/Subcutaneous Tissue Debridement with a total area of 0.35 sq cm performed by Lawanda Cousins, NP. With the following instrument(s): Curette. to remove Viable and Non-Viable tissue/material Material removed includes Subcutaneous Tissue, and Slough, Fibrin/Exudate, and [place] after achieving pain control using Lidocaine 4% Topical Solution. No specimens were taken. A time out was conducted at 13:58, prior to the start of the procedure. A Minimum amount of bleeding was controlled with Pressure. The procedure was tolerated well with a pain level of 0 throughout and a pain level of 0 following the procedure. Post Debridement Measurements: 0.7cm length x 0.5cm width x 0.6cm depth; 0.165cm^3 volume. Character of Wound/Ulcer  Post Debridement requires further debridement. Post procedure Diagnosis Wound #1: Same as Pre-Procedure Plan Wound Cleansing: Wound #1 Posterior Neck: Clean wound with Normal Saline. [name], [name] ([id number]) Cleanse wound with mild soap and water Anesthetic (add to Medication List): Wound #1 Posterior Neck: Topical Lidocaine 4% cream applied to wound bed prior to debridement (In Clinic Only). Skin Barriers/Peri-Wound Care: Wound #1 Posterior Neck: Skin Prep Primary Wound Dressing: Wound #1 Posterior Neck: Saline moistened gauze Santyl Ointment Secondary Dressing: Wound #1 Posterior Neck: Dry Gauze Boardered Foam Dressing - or telfa island Dressing Change Frequency: Wound #1 Posterior Neck: Change dressing every day. Follow-up Appointments: Wound #1 Posterior Neck: Return Appointment in 1 week. Additional Orders / Instructions: Wound #1 Posterior Neck: Increase protein intake. Other: - multivitamin The following medication(s) was prescribed: lidocaine topical 4 % cream 1 1 cream topical was prescribed at facility Electronic Signature(s) Signed: 05/03/2017 2:18:56 PM By: Lawanda Cousins Entered By: Lawanda Cousins on 05/03/2017 14:18:56 [name], [name] ([id number]) -------------------------------------------------------------------------------- ROS/PFSH Details Patient Name: Jane Bouche A. Date of Service: 05/03/2017 1:30 PM Medical Record Number: [id number] Patient Account Number: [id number] Date of Birth/Sex: [birth year] (66 y.o. F) Treating RN: Ahmed Prima Primary Care Provider: Lavera Guise Other Clinician: Referring Provider: Lavera Guise Treating Provider/Extender: Cathie Olden in Treatment: 2 Information Obtained From Patient Wound History Do you currently have one or more open woundso Yes How many open wounds do you currently haveo 1 Approximately how long have you had your woundso 3 weeks How have you been treating your wound(s) until nowo open to  air Has your wound(s) ever healed and then re-openedo No Have you had any lab work done in the past montho No Have you tested positive for an antibiotic resistant organism (MRSA, VRE)o No Have you tested positive for osteomyelitis (bone infection)o No Have you had any tests for circulation on your legso No Hematologic/Lymphatic Medical History: Negative for: Anemia; Hemophilia; Human Immunodeficiency Virus; Lymphedema; Sickle Cell Disease Respiratory Medical History: Negative for: Aspiration; Asthma; Chronic Obstructive Pulmonary Disease (COPD); Pneumothorax; Sleep Apnea; Tuberculosis Cardiovascular Medical History: Negative for: Angina; Arrhythmia; Congestive Heart Failure; Coronary Artery Disease; Deep Vein Thrombosis; Hypertension; Hypotension; Myocardial Infarction; Peripheral Arterial Disease; Peripheral Venous Disease; Phlebitis; Vasculitis Gastrointestinal Medical History: Negative for: Cirrhosis ; Colitis; Crohnos; Hepatitis A; Hepatitis B; Hepatitis C Endocrine Medical History: Negative for: Type I Diabetes; Type II Diabetes Immunological Medical History: Negative for: Lupus Erythematosus; Raynaudos; Scleroderma Musculoskeletal Medical History: Positive for: Osteoarthritis [name], [name] ([id number]) Negative for: Gout; Rheumatoid Arthritis; Osteomyelitis Past Medical History Notes: spinal cord mass removed and screws inserted March 26 2017 Neurologic Medical History: Positive for: Neuropathy - right hand Negative for: Dementia; Quadriplegia; Paraplegia; Seizure Disorder Oncologic Medical History: Negative for: Received Chemotherapy; Received  Radiation Immunizations Pneumococcal Vaccine: Received Pneumococcal Vaccination: Yes Immunization Notes: up to date Implantable Devices Family and Social History Cancer: No; Diabetes: No; Heart Disease: No; Hereditary Spherocytosis: No; Hypertension: No; Kidney Disease: No; Lung Disease: Yes - Father; Seizures: No;  Stroke: No; Thyroid Problems: No; Tuberculosis: No; Never smoker; Marital Status - Married; Alcohol Use: Rarely; Drug Use: No History; Caffeine Use: Daily; Financial Concerns: No; Food, Clothing or Shelter Needs: No; Support System Lacking: No; Transportation Concerns: No; Advanced Directives: No; Patient does not want information on Advanced Directives Physician Affirmation I have reviewed and agree with the above information. Electronic Signature(s) Signed: 05/03/2017 5:04:33 PM By: Lawanda Cousins Signed: 05/04/2017 4:29:16 PM By: Alric Quan Entered By: Lawanda Cousins on 05/03/2017 14:18:41 Moorer, Jane [name] Lang ([id number]) -------------------------------------------------------------------------------- SuperBill Details Patient Name: Jane Bouche A. Date of Service: 05/03/2017 Medical Record Number: [id number] Patient Account Number: [id number] Date of Birth/Sex: 17-Mar-1950 (67 y.o. 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 G95.9 Disease of spinal cord, unspecified S11.80XS Unspecified open wound of other specified part of neck, sequela Facility Procedures CPT4 Code: 54982641 Description: 58309 - DEB SUBQ TISSUE 20 SQ CM/< ICD-10 Diagnosis Description S11.80XS Unspecified open wound of other specified part of neck, s Modifier: equela Quantity: 1 Physician Procedures CPT4 Code: [id number] Description: [place] - WC PHYS SUBQ TISS 20 SQ CM ICD-10 Diagnosis Description S11.80XS Unspecified open wound of other specified part of neck, s Modifier: equela Quantity: 1 Electronic Signature(s) Signed: 05/03/2017 2:19:08 PM By: Lawanda Cousins Entered By: Lawanda Cousins on 05/03/2017 14:19:08

## 2017-05-08 NOTE — Progress Notes (Signed)
MILCAH, DULANY ([id number]) Visit Report for 05/03/2017 Arrival Information Details Patient Name: ALEEZA, BELLVILLE A. Date of Service: 05/03/2017 1:30 PM Medical Record Number: [id number] Patient Account Number: [id number] Date of Birth/Sex: 1950-07-14 (66 y.o. F) Treating RN: Montey Hora Primary Care Meegan Shanafelt: Lavera Guise Other Clinician: Referring Merie Wulf: Lavera Guise Treating Juwon Scripter/Extender: Cathie Olden in Treatment: 2 Visit Information History Since Last Visit Added or deleted any medications: No Patient Arrived: Ambulatory Any new allergies or adverse reactions: No Arrival Time: 13:44 Had a fall or experienced change in No Accompanied By: husband activities of daily living that may affect Transfer Assistance: None risk of falls: Patient Identification Verified: Yes Signs or symptoms of abuse/neglect since last visito No Secondary Verification Process Completed: Yes Hospitalized since last visit: No Implantable device outside of the clinic excluding No cellular tissue based products placed in the center since last visit: Has Dressing in Place as Prescribed: Yes Pain Present Now: No Electronic Signature(s) Signed: 05/04/2017 4:29:16 PM By: Alric Quan Entered By: Alric Quan on 05/03/2017 13:55:45 Credit, Kayliegh AMarland Kitchen ([id number]) -------------------------------------------------------------------------------- Encounter Discharge Information Details Patient Name: [name] Date of Service: 05/03/2017 1:30 PM Medical Record Number: [id number] Patient Account Number: [id number] Date of Birth/Sex: February 28, 1950 (66 y.o. F) Treating RN: Ahmed Prima Primary Care Shaylyn Bawa: Lavera Guise Other Clinician: Referring Lavonta Tillis: Lavera Guise Treating Shafer Swamy/Extender: Cathie Olden in Treatment: 2 Encounter Discharge Information Items Discharge Pain Level: 0 Discharge Condition: Stable Ambulatory Status: Ambulatory Discharge Destination:  Home Private Transportation: Auto Accompanied By: husband Schedule Follow-up Appointment: Yes Medication Reconciliation completed and provided No to Patient/Care Ahmar Pickrell: Clinical Summary of Care: Electronic Signature(s) Signed: 05/04/2017 4:29:16 PM By: Alric Quan Entered By: Alric Quan on 05/03/2017 14:01:50 Woodlief, Jayleena A. ([id number]) -------------------------------------------------------------------------------- Lower Extremity Assessment Details Patient Name: [name] Date of Service: 05/03/2017 1:30 PM Medical Record Number: [id number] Patient Account Number: [id number] Date of Birth/Sex: 1950-12-28 (66 y.o. F) Treating RN: Montey Hora Primary Care Ranita Stjulien: Lavera Guise Other Clinician: Referring Shadd Dunstan: Lavera Guise Treating Bernadine Melecio/Extender: Cathie Olden in Treatment: 2 Electronic Signature(s) Signed: 05/03/2017 3:02:22 PM By: Montey Hora Entered By: Montey Hora on 05/03/2017 13:53:51 Marotto, Dandria AMarland Kitchen ([id number]) -------------------------------------------------------------------------------- Multi Wound Chart Details Patient Name: [name] Date of Service: 05/03/2017 1:30 PM Medical Record Number: [id number] Patient Account Number: [id number] Date of Birth/Sex: 05-09-1950 (66 y.o. F) Treating RN: Ahmed Prima Primary Care Yaziel Brandon: Lavera Guise Other Clinician: Referring Sintia Mckissic: Lavera Guise Treating Basem Yannuzzi/Extender: Cathie Olden in Treatment: 2 Vital Signs Height(in): 69 Pulse(bpm): 92 Weight(lbs): 260 Blood Pressure(mmHg): 130/81 Body Mass Index(BMI): 38 Temperature(F): 98.4 Respiratory Rate 16 (breaths/min): Photos: [1:No Photos] [N/A:N/A] Wound Location: [1:Neck - Posterior] [N/A:N/A] Wounding Event: [1:Surgical Injury] [N/A:N/A] Primary Etiology: [1:Open Surgical Wound] [N/A:N/A] Comorbid History: [1:Osteoarthritis, Neuropathy] [N/A:N/A] Date Acquired: [1:03/26/2017] [N/A:N/A] Weeks of  Treatment: [1:2] [N/A:N/A] Wound Status: [1:Open] [N/A:N/A] Measurements L x W x D [1:0.7x0.5x0.5] [N/A:N/A] (cm) Area (cm) : [1:0.275] [N/A:N/A] Volume (cm) : [1:0.137] [N/A:N/A] % Reduction in Area: [1:45.30%] [N/A:N/A] % Reduction in Volume: [1:54.60%] [N/A:N/A] Classification: [1:Full Thickness Without Exposed Support Structures] [N/A:N/A] Exudate Amount: [1:Medium] [N/A:N/A] Exudate Type: [1:Serous] [N/A:N/A] Exudate Color: [1:amber] [N/A:N/A] Wound Margin: [1:Flat and Intact] [N/A:N/A] Granulation Amount: [1:None Present (0%)] [N/A:N/A] Necrotic Amount: [1:Large (67-100%)] [N/A:N/A] Exposed Structures: [1:Fat Layer (Subcutaneous Tissue) Exposed: Yes Fascia: No Tendon: No Muscle: No Joint: No Bone: No] [N/A:N/A] Epithelialization: [1:Small (1-33%)] [N/A:N/A] Debridement: [1:Debridement - Excisional] [N/A:N/A] Pre-procedure [1:13:58] [N/A:N/A] Verification/Time Out Taken: Pain Control: [1:Lidocaine 4% Topical Solution] [N/A:N/A] Tissue Debrided: [1:Subcutaneous, Slough] [N/A:N/A]  Level: [1:Skin/Subcutaneous Tissue] [N/A:N/A] Debridement Area (sq cm): [1:0.35] [N/A:N/A] Instrument: Curette N/A N/A Bleeding: Minimum N/A N/A Hemostasis Achieved: Pressure N/A N/A Procedural Pain: 0 N/A N/A Post Procedural Pain: 0 N/A N/A Debridement Treatment Procedure was tolerated well N/A N/A Response: Post Debridement 0.7x0.5x0.6 N/A N/A Measurements L x W x D (cm) Post Debridement Volume: 0.165 N/A N/A (cm) Periwound Skin Texture: Excoriation: No N/A N/A Induration: No Callus: No Crepitus: No Rash: No Scarring: No Periwound Skin Moisture: Maceration: No N/A N/A Dry/Scaly: No Periwound Skin Color: Ecchymosis: Yes N/A N/A Atrophie Blanche: No Cyanosis: No Erythema: No Hemosiderin Staining: No Mottled: No Pallor: No Rubor: No Temperature: No Abnormality N/A N/A Tenderness on Palpation: No N/A N/A Wound Preparation: Ulcer Cleansing: N/A N/A Rinsed/Irrigated with  Saline Topical Anesthetic Applied: Other: lidocaine 4% Procedures Performed: Debridement N/A N/A Treatment Notes Wound #1 (Posterior Neck) 1. Cleansed with: Clean wound with Normal Saline 2. Anesthetic Topical Lidocaine 4% cream to wound bed prior to debridement 3. Peri-wound Care: Skin Prep 5. Secondary Dressing Applied Bordered Foam Dressing Notes no santyl today in office pt is leaving here and going to her surgeons appt Electronic Signature(s) Signed: 05/03/2017 2:17:13 PM By: Lawanda Cousins Entered By: Lawanda Cousins on 05/03/2017 [place], Coleman. ([id number]) [name] ([id number]) -------------------------------------------------------------------------------- [place] Details Patient Name: MYESHA, STILLION A. Date of Service: 05/03/2017 1:30 PM Medical Record Number: [id number] Patient Account Number: [id number] Date of Birth/Sex: 1950-12-11 (66 y.o. F) Treating RN: Ahmed Prima Primary Care Tahara Ruffini: Lavera Guise Other Clinician: Referring Brayton Baumgartner: Lavera Guise Treating Inella Kuwahara/Extender: Cathie Olden in Treatment: 2 Active Inactive ` Orientation to the Wound Care Program Nursing Diagnoses: Knowledge deficit related to the wound healing center program Goals: Patient/caregiver will verbalize understanding of the [facility] Program Date Initiated: 04/19/2017 Target Resolution Date: 05/19/2017 Goal Status: Active Interventions: Provide education on orientation to the wound center Notes: ` Wound/Skin Impairment Nursing Diagnoses: Impaired tissue integrity Knowledge deficit related to ulceration/compromised skin integrity Goals: Ulcer/skin breakdown will have a volume reduction of 80% by week 12 Date Initiated: 04/19/2017 Target Resolution Date: 08/11/2017 Goal Status: Active Interventions: Assess patient/caregiver ability to perform ulcer/skin care regimen upon admission and as needed Assess ulceration(s) every  visit Notes: Electronic Signature(s) Signed: 05/04/2017 4:29:16 PM By: Alric Quan Entered By: Alric Quan on 05/03/2017 13:55:51 [place], Francoise A. ([id number]) -------------------------------------------------------------------------------- Pain Assessment Details Patient Name: [name] Date of Service: 05/03/2017 1:30 PM Medical Record Number: [id number] Patient Account Number: [id number] Date of Birth/Sex: May 17, 1950 (66 y.o. F) Treating RN: Montey Hora Primary Care Sayeed Weatherall: Lavera Guise Other Clinician: Referring Ramces Shomaker: Lavera Guise Treating Evangelina Delancey/Extender: Cathie Olden in Treatment: 2 Active Problems Location of Pain Severity and Description of Pain Patient Has Paino No Site Locations Pain Management and Medication Current Pain Management: Electronic Signature(s) Signed: 05/03/2017 3:02:22 PM By: Montey Hora Entered By: Montey Hora on 05/03/2017 13:47:53 Weiher, [name] ([id number]) -------------------------------------------------------------------------------- Patient/Caregiver Education Details Patient Name: [name] Date of Service: 05/03/2017 1:30 PM Medical Record Number: [id number] Patient Account Number: [id number] Date of Birth/Gender: 26-Jan-1950 (67 y.o. F) Treating RN: Ahmed Prima Primary Care Physician: Lavera Guise Other Clinician: Referring Physician: Lavera Guise Treating Physician/Extender: Cathie Olden in Treatment: 2 Education Assessment Education Provided To: Patient Education Topics Provided Wound/Skin Impairment: Handouts: Caring for Your Ulcer, Other: change dressing as ordered Methods: Demonstration, Explain/Verbal Responses: State content correctly Electronic Signature(s) Signed: 05/04/2017 4:29:16 PM By: Alric Quan Entered By: Alric Quan on 05/03/2017 14:02:07 Gupta, Tersa A. (  [id number]) -------------------------------------------------------------------------------- Wound  Assessment Details Patient Name: ELVERA, ALMARIO A. Date of Service: 05/03/2017 1:30 PM Medical Record Number: [id number] Patient Account Number: [id number] Date of Birth/Sex: 10-12-1950 (66 y.o. F) Treating RN: Montey Hora Primary Care Ronya Gilcrest: Lavera Guise Other Clinician: Referring Akirra Lacerda: Lavera Guise Treating Finn Altemose/Extender: Cathie Olden in Treatment: 2 Wound Status Wound Number: 1 Primary Etiology: Open Surgical Wound Wound Location: Neck - Posterior Wound Status: Open Wounding Event: Surgical Injury Comorbid History: Osteoarthritis, Neuropathy Date Acquired: 03/26/2017 Weeks Of Treatment: 2 Clustered Wound: No Photos Photo Uploaded By: Montey Hora on 05/03/2017 14:56:50 Wound Measurements Length: (cm) 0.7 Width: (cm) 0.5 Depth: (cm) 0.5 Area: (cm) 0.275 Volume: (cm) 0.137 % Reduction in Area: 45.3% % Reduction in Volume: 54.6% Epithelialization: Small (1-33%) Tunneling: No Undermining: No Wound Description Full Thickness Without Exposed Support Classification: Structures Wound Margin: Flat and Intact Exudate Medium Amount: Exudate Type: Serous Exudate Color: amber Foul Odor After Cleansing: No Slough/Fibrino Yes Wound Bed Granulation Amount: None Present (0%) Exposed Structure Necrotic Amount: Large (67-100%) Fascia Exposed: No Necrotic Quality: Adherent Slough Fat Layer (Subcutaneous Tissue) Exposed: Yes Tendon Exposed: No Muscle Exposed: No Joint Exposed: No Bone Exposed: No Ryer, Danilynn A. ([id number]) Periwound Skin Texture Texture Color No Abnormalities Noted: No No Abnormalities Noted: No Callus: No Atrophie Blanche: No Crepitus: No Cyanosis: No Excoriation: No Ecchymosis: Yes Induration: No Erythema: No Rash: No Hemosiderin Staining: No Scarring: No Mottled: No Pallor: No Moisture Rubor: No No Abnormalities Noted: No Dry / Scaly: No Temperature / Pain Maceration: No Temperature: No Abnormality Wound  Preparation Ulcer Cleansing: Rinsed/Irrigated with Saline Topical Anesthetic Applied: Other: lidocaine 4%, Treatment Notes Wound #1 (Posterior Neck) 1. Cleansed with: Clean wound with Normal Saline 2. Anesthetic Topical Lidocaine 4% cream to wound bed prior to debridement 3. Peri-wound Care: Skin Prep 5. Secondary Dressing Applied Bordered Foam Dressing Notes no santyl today in office pt is leaving here and going to her surgeons appt Electronic Signature(s) Signed: 05/03/2017 3:02:22 PM By: Montey Hora Entered By: Montey Hora on 05/03/2017 13:53:42 Simkins, Myranda A. ([id number]) -------------------------------------------------------------------------------- [place] Details Patient Name: [name] Date of Service: 05/03/2017 1:30 PM Medical Record Number: [id number] Patient Account Number: [id number] Date of Birth/Sex: [birth year] (66 y.o. F) Treating RN: Montey Hora Primary Care Quron Ruddy: Lavera Guise Other Clinician: Referring Roey Coopman: Lavera Guise Treating Maxximus Gotay/Extender: Cathie Olden in Treatment: 2 Vital Signs Time Taken: 13:48 Temperature (F): 98.4 Height (in): 69 Pulse (bpm): 92 Weight (lbs): 260 Respiratory Rate (breaths/min): 16 Body Mass Index (BMI): 38.4 Blood Pressure (mmHg): 130/81 Reference Range: 80 - 120 mg / dl Electronic Signature(s) Signed: 05/03/2017 3:02:22 PM By: Montey Hora Entered By: Montey Hora on 05/03/2017 13:48:38

## 2017-05-10 ENCOUNTER — Encounter: Payer: Medicare Other | Attending: Nurse Practitioner | Admitting: Nurse Practitioner

## 2017-05-10 DIAGNOSIS — G629 Polyneuropathy, unspecified: Secondary | ICD-10-CM | POA: Diagnosis not present

## 2017-05-10 DIAGNOSIS — T8131XA Disruption of external operation (surgical) wound, not elsewhere classified, initial encounter: Secondary | ICD-10-CM | POA: Diagnosis present

## 2017-05-10 DIAGNOSIS — X58XXXA Exposure to other specified factors, initial encounter: Secondary | ICD-10-CM | POA: Insufficient documentation

## 2017-05-10 DIAGNOSIS — J449 Chronic obstructive pulmonary disease, unspecified: Secondary | ICD-10-CM | POA: Insufficient documentation

## 2017-05-10 DIAGNOSIS — M1990 Unspecified osteoarthritis, unspecified site: Secondary | ICD-10-CM | POA: Insufficient documentation

## 2017-05-10 DIAGNOSIS — I739 Peripheral vascular disease, unspecified: Secondary | ICD-10-CM | POA: Insufficient documentation

## 2017-05-10 DIAGNOSIS — Z86718 Personal history of other venous thrombosis and embolism: Secondary | ICD-10-CM | POA: Insufficient documentation

## 2017-05-15 NOTE — Progress Notes (Signed)
RIN, GORTON ([id number]) Visit Report for 05/10/2017 Chief Complaint Document Details Patient Name: Jane [name], Jane [name] A. Date of Service: 05/10/2017 12:00 PM Medical Record Number: [id number] Patient Account Number: [id number] Date of Birth/Sex: [birth year] (66 y.o. F) Treating RN: Ahmed Prima Primary Care Provider: Lavera Guise Other Clinician: Referring Provider: Lavera Guise Treating Provider/Extender: Cathie Olden in Treatment: 3 Information Obtained from: Patient Chief Complaint She is here for evaluation of a cervical neck surgical site dehiscence Electronic Signature(s) Signed: 05/10/2017 12:32:44 PM By: Lawanda Cousins Entered By: Lawanda Cousins on 05/10/2017 12:32:43 Newsome, Velicia A. ([id number]) -------------------------------------------------------------------------------- Debridement Details Patient Name: Jane Bouche A. Date of Service: 05/10/2017 12:00 PM Medical Record Number: [id number] Patient Account Number: [id number] Date of Birth/Sex: 1950/10/20 (66 y.o. F) Treating RN: Ahmed Prima Primary Care Provider: Lavera Guise Other Clinician: Referring Provider: Lavera Guise Treating Provider/Extender: Cathie Olden in Treatment: 3 Debridement Performed for Wound #1 Posterior Neck Assessment: Performed By: Physician Lawanda Cousins, NP Debridement Type: Debridement Pre-procedure Verification/Time Yes - 12:10 Out Taken: Start Time: 12:10 Pain Control: Lidocaine 4% Topical Solution Total Area Debrided (L x W): 0.5 (cm) x 0.2 (cm) = 0.1 (cm) Tissue and other material Viable, Non-Viable, Slough, Fibrin/Exudate, Slough debrided: Level: Non-Viable Tissue Debridement Description: Selective/Open Wound Instrument: Curette Bleeding: Minimum Hemostasis Achieved: Pressure End Time: 12:12 Procedural Pain: 0 Post Procedural Pain: 0 Response to Treatment: Procedure was tolerated well Post Debridement Measurements of Total Wound Length: (cm) 0.5 Width: (cm)  0.2 Depth: (cm) 0.2 Volume: (cm) 0.016 Character of Wound/Ulcer Post Debridement: Requires Further Debridement Post Procedure Diagnosis Same as Pre-procedure Electronic Signature(s) Signed: 05/10/2017 12:32:35 PM By: Lawanda Cousins Signed: 05/11/2017 4:40:49 PM By: Alric Quan Entered By: Lawanda Cousins on 05/10/2017 12:32:35 Raburn, Shakiya A. ([id number]) -------------------------------------------------------------------------------- HPI Details Patient Name: Jane Bouche A. Date of Service: 05/10/2017 12:00 PM Medical Record Number: [id number] Patient Account Number: [id number] Date of Birth/Sex: Jan 28, 1950 (66 y.o. F) Treating RN: Ahmed Prima Primary Care Provider: Lavera Guise Other Clinician: Referring Provider: Lavera Guise Treating Provider/Extender: Cathie Olden in Treatment: 3 History of Present Illness HPI Description: 04/19/17-She is here for evaluation of a cervical neck wound dehiscence. 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 Sutures were removed on 4/4 there is no disc noted concerned and the crevice of the posterior cervical spine. She'll follow-up appointment on 4/9 where she was prescribed Augmentin for 2 weeks and consult to wound clinic. She presents with limited range of motion to her neck which is not new postoperatively. There is a small area of nonviable tissue and a natural crevice in her posterior neck. We will treat with santyl, they are to call if unable to afford. She will follow up next week. 04/26/17- She is here in follow-up evaluation for cervical neck wound. She has an appointment with Dr. Cari Caraway next Thursday. She states that the Augmentin prescribed by the surgeon on 4/9 was supposed to be for 2 weeks but they were only provided with enough medication to take for 1 week; they have been encouraged to contact the surgeon's office regarding this discrepancy. They were able to get the  Santyl but there was a delay at the pharmacy they were unable to pick it up until Monday. There is improvement in the slough consistency, otherwise no significant change. She will continue the same treatment plan and follow up next week. 05/03/17-she is here in follow-up evaluation for cervical neck wound. She has an appointment with neurosurgery  after this appointment. She completed her Augmentin today. There is new visible areas of depth to the most proximal and distal aspect. We will continue with Santyl for 1-2 weeks and anticipate transitioning to collagen product at that time. 05/10/17-She is here in follow up evaluation for cervical neck wound. Jennifer with neurosurgery last week with no change in treatment plan. We will continue with Santyl and she will follow-up in 2 weeks. Electronic Signature(s) Signed: 05/10/2017 12:33:10 PM By: Lawanda Cousins Entered By: Lawanda Cousins on 05/10/2017 12:33:10 Jane [name] ([id number]) -------------------------------------------------------------------------------- Physician Orders Details Patient Name: Jane Bouche A. Date of Service: 05/10/2017 12:00 PM Medical Record Number: [id number] Patient Account Number: [id number] Date of Birth/Sex: 28-Dec-1950 (66 y.o. F) Treating RN: Ahmed Prima Primary Care Provider: Lavera Guise Other Clinician: Referring Provider: Lavera Guise Treating Provider/Extender: Cathie Olden in Treatment: 3 Verbal / Phone Orders: Yes Clinician: Pinkerton, Debi Read Back and Verified: Yes Diagnosis Coding Wound Cleansing Wound #1 Posterior Neck o Clean wound with Normal Saline. o Cleanse wound with mild soap and water Anesthetic (add to Medication List) Wound #1 Posterior Neck o Topical Lidocaine 4% cream applied to wound bed prior to debridement (In Clinic Only). Skin Barriers/Peri-Wound Care Wound #1 Posterior Neck o Skin Prep Primary Wound Dressing Wound #1 Posterior Neck o Saline moistened  gauze o Santyl Ointment Secondary Dressing Wound #1 Posterior Neck o Dry Gauze o Boardered Foam Dressing - or telfa island Dressing Change Frequency Wound #1 Posterior Neck o Change dressing every day. Follow-up Appointments Wound #1 Posterior Neck o Return Appointment in 1 week. Additional Orders / Instructions Wound #1 Posterior Neck o Increase protein intake. o Other: - multivitamin Patient Medications Allergies: No Known Allergies Notifications Medication Indication Start End lidocaine Goedken, Aysa A. ([id number]) Notifications Medication Indication Start End DOSE 1 - topical 4 % cream - 1 cream topical Electronic Signature(s) Signed: 05/10/2017 12:54:19 PM By: Lawanda Cousins Signed: 05/11/2017 4:40:49 PM By: Alric Quan Entered By: Alric Quan on 05/10/2017 12:13:34 Jane [name] ([id number]) -------------------------------------------------------------------------------- Prescription 05/10/2017 Patient Name: Jane Bouche A. Provider: Lawanda Cousins NP Date of Birth: [birth year] NPI#: [id number] Sex: F DEA#: [id number] Phone #: [phone] License #: Patient Address: [facility] [place], [place] [address] [address], [place], [place] [address] [phone] Allergies No Known Allergies Medication Medication: Route: Strength: Form: lidocaine topical 4% cream Class: TOPICAL LOCAL ANESTHETICS Dose: Frequency / Time: Indication: 1 1 cream topical Number of Refills: Number of Units: 0 Generic Substitution: Start Date: End Date: Administered at Substitution Permitted Facility: Yes Time Administered: Time Discontinued: Note to Pharmacy: Signature(s): Date(s): Electronic Signature(s) Signed: 05/10/2017 12:54:19 PM By: Lawanda Cousins Signed: 05/11/2017 4:40:49 PM By: Alric Quan Entered By: Alric Quan on 05/10/2017 12:13:34 Jane [name] ([id number]) Jane [name], [name] ([id number]) --------------------------------------------------------------------------------  Problem List Details Patient Name: Jane Bouche A. Date of Service: 05/10/2017 12:00 PM Medical Record Number: [id number] Patient Account Number: [id number] Date of Birth/Sex: 02-23-1950 (66 y.o. 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 Date of Service: 05/10/2017 12:00 PM Medical Record Number: [id number] Patient Account Number: [id number] Date of Birth/Sex: September 17, 1950 (66 y.o. F) Treating RN: Ahmed Prima Primary Care Provider: Lavera Guise Other Clinician: Referring Provider: Lavera Guise Treating Provider/Extender: Cathie Olden in Treatment: 3 Subjective Chief Complaint Information obtained from Patient She is here for evaluation of a cervical neck surgical site dehiscence History of Present Illness (HPI) 04/19/17-She is here for evaluation of a cervical neck wound dehiscence. 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 Sutures were removed on 4/4 there is no disc noted concerned and the crevice of the posterior  cervical spine. She'll follow-up appointment on 4/9 where she was prescribed Augmentin for 2 weeks and consult to wound clinic. She presents with limited range of motion to her neck which is not new postoperatively. There is a small area of nonviable tissue and a natural crevice in her posterior neck. We will treat with santyl, they are to call if unable to afford. She will follow up next week. 04/26/17- She is here in follow-up evaluation for cervical neck wound. She has an appointment with Dr. Cari Caraway next Thursday. She states that the Augmentin prescribed by the surgeon on 4/9 was supposed to be for 2 weeks but they were only provided with enough medication to take for 1 week; they have been encouraged to contact the surgeon's office regarding this discrepancy. They were able to get the Santyl but there was a delay at the pharmacy they were unable to pick it up until Monday. There is improvement in the slough consistency, otherwise no significant change. She will continue the same treatment plan and follow up next week. 05/03/17-she is here in follow-up evaluation for cervical neck wound. She has an appointment with neurosurgery after this appointment. She completed her Augmentin today. There is new visible areas of depth to the most proximal and distal aspect. We will continue with Santyl for 1-2 weeks and anticipate transitioning to collagen product at that time. 05/10/17-She is here in follow up evaluation for cervical neck wound. Jennifer with neurosurgery last week with no change in treatment plan. We will continue with Santyl and she will follow-up in 2 weeks. Patient History Information obtained from Patient. Family History Lung Disease - Father, No family history of Cancer, Diabetes, Heart Disease, Hereditary Spherocytosis, Hypertension, Kidney Disease, Seizures, Stroke, Thyroid Problems, Tuberculosis. Social History Never smoker, Marital Status - Married, Alcohol Use - Rarely, Drug  Use - No History, Caffeine Use - Daily. Medical And Surgical History Notes Musculoskeletal spinal cord mass removed and screws inserted March 26 2017 SHELONDA, SAXE A. ([id number]) Objective Constitutional Vitals Time Taken: 11:53 AM, Height: 69 in, Weight: 260 lbs, BMI: 38.4, Temperature: 98.3 F, Pulse: 77 bpm, Respiratory Rate: 16 breaths/min, Blood Pressure: 140/85 mmHg. Integumentary (Hair, Skin) Wound #1 status is Open. Original cause of wound was Surgical Injury. The wound is located on the Posterior Neck. The wound measures 0.5cm length x 0.2cm width x 0.1cm depth; 0.079cm^2 area and 0.008cm^3 volume. There is Fat Layer (Subcutaneous Tissue) Exposed exposed. There is no tunneling or undermining noted. There is a medium amount of serous drainage noted. The wound margin is flat and intact. There is no granulation within the wound bed. There is a large (67-100%) amount of necrotic tissue within the wound bed including Adherent Slough. The periwound  skin appearance exhibited: Ecchymosis. The periwound skin appearance did not exhibit: Callus, Crepitus, Excoriation, Induration, Rash, Scarring, Dry/Scaly, Maceration, Atrophie Blanche, Cyanosis, Hemosiderin Staining, Mottled, Pallor, Rubor, Erythema. Periwound temperature was noted as No Abnormality. 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 There was a Selective/Open Wound Non-Viable Tissue Debridement with a total area of 0.1 sq cm performed by Lawanda Cousins, NP. With the following instrument(s): Curette. to remove Viable and Non-Viable tissue/material Material removed includes and [place] after achieving pain control  using Lidocaine 4% Topical Solution. No specimens were taken. A time out was conducted at 12:10, prior to the start of the procedure. A Minimum amount of bleeding was controlled with Pressure. The procedure was tolerated well with a pain level of 0 throughout and a pain level of 0 following the procedure. Post Debridement Measurements: 0.5cm length x 0.2cm width x 0.2cm depth; 0.016cm^3 volume. Character of Wound/Ulcer Post Debridement requires further debridement. Post procedure Diagnosis Wound #1: Same as Pre-Procedure Plan Jane [name], [name] ([id number]) Wound Cleansing: Wound #1 Posterior Neck: Clean wound with Normal Saline. Cleanse wound with mild soap and water Anesthetic (add to Medication List): Wound #1 Posterior Neck: Topical Lidocaine 4% cream applied to wound bed prior to debridement (In Clinic Only). Skin Barriers/Peri-Wound Care: Wound #1 Posterior Neck: Skin Prep Primary Wound Dressing: Wound #1 Posterior Neck: Saline moistened gauze Santyl Ointment Secondary Dressing: Wound #1 Posterior Neck: Dry Gauze Boardered Foam Dressing - or telfa island Dressing Change Frequency: Wound #1 Posterior Neck: Change dressing every day. Follow-up Appointments: Wound #1 Posterior Neck: Return Appointment in 1 week. Additional Orders / Instructions: Wound #1 Posterior Neck: Increase protein intake. Other: - multivitamin The following medication(s) was prescribed: lidocaine topical 4 % cream 1 1 cream topical was prescribed at facility Electronic Signature(s) Signed: 05/10/2017 12:33:43 PM By: Lawanda Cousins Entered By: Lawanda Cousins on 05/10/2017 12:33:43 Jane [name] ([id number]) -------------------------------------------------------------------------------- ROS/PFSH Details Patient Name: Jane Bouche A. Date of Service: 05/10/2017 12:00 PM Medical Record Number: [id number] Patient Account Number: [id number] Date of Birth/Sex: April 28, 1950 (66 y.o. F) Treating RN: Ahmed Prima Primary Care Provider: Lavera Guise Other Clinician: Referring Provider: Lavera Guise Treating Provider/Extender: Cathie Olden in Treatment: 3 Information Obtained From Patient Wound History Do you currently have one or more open woundso Yes How many open wounds do you currently haveo 1 Approximately how long have you had your woundso 3 weeks How have you been treating your wound(s) until nowo open to air Has your wound(s) ever healed and then re-openedo No Have you had any lab work done in the past montho No Have you tested positive for an antibiotic resistant organism (MRSA, VRE)o No Have you tested positive for osteomyelitis (bone infection)o No Have you had any tests for circulation on your legso No Hematologic/Lymphatic Medical History: Negative for: Anemia; Hemophilia; Human Immunodeficiency Virus; Lymphedema; Sickle Cell Disease Respiratory Medical History: Negative for: Aspiration; Asthma; Chronic Obstructive Pulmonary Disease (COPD); Pneumothorax; Sleep Apnea; Tuberculosis Cardiovascular Medical History: Negative for: Angina; Arrhythmia; Congestive Heart Failure; Coronary Artery Disease; Deep Vein Thrombosis; Hypertension; Hypotension; Myocardial Infarction; Peripheral Arterial Disease; Peripheral Venous Disease; Phlebitis; Vasculitis Gastrointestinal Medical History: Negative for: Cirrhosis ; Colitis; Crohnos; Hepatitis A; Hepatitis B; Hepatitis C Endocrine Medical History:  Negative for: Type I Diabetes; Type II Diabetes Immunological Medical History: Negative for: Lupus Erythematosus; Raynaudos; Scleroderma Musculoskeletal Medical History: Positive for: Osteoarthritis Houseman, Anuradha A. ([id number]) Negative for: Gout; Rheumatoid Arthritis; Osteomyelitis Past Medical History Notes: spinal cord mass removed and screws inserted March 26 2017 Neurologic Medical History: Positive for: Neuropathy - right hand Negative for: Dementia; Quadriplegia;  Paraplegia; Seizure Disorder Oncologic Medical History: Negative for: Received Chemotherapy; Received Radiation Immunizations Pneumococcal Vaccine: Received Pneumococcal Vaccination: Yes Immunization Notes: up to date Implantable Devices Family and Social History Cancer: No; Diabetes: No; Heart Disease: No; Hereditary Spherocytosis: No; Hypertension: No; Kidney Disease: No; Lung Disease: Yes - Father; Seizures: No; Stroke: No; Thyroid Problems: No; Tuberculosis: No; Never smoker; Marital Status - Married; Alcohol Use: Rarely; Drug Use: No History; Caffeine Use: Daily; Financial Concerns: No; Food, Clothing or Shelter Needs: No; Support System Lacking: No; Transportation Concerns: No; Advanced Directives: No; Patient does not want information on Advanced Directives Physician Affirmation I have reviewed and agree with the above information. Electronic Signature(s) Signed: 05/10/2017 12:54:19 PM By: Lawanda Cousins Signed: 05/11/2017 4:40:49 PM By: Alric Quan Entered By: Lawanda Cousins on 05/10/2017 12:33:28 Jane [name] ([id number]) -------------------------------------------------------------------------------- SuperBill Details Patient Name: Jane Bouche A. Date of Service: 05/10/2017 Medical Record Number: [id number] Patient Account Number: [id number] Date of Birth/Sex: 1950-12-30 (67 y.o. 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 G95.9 Disease of spinal cord, unspecified S11.80XS Unspecified open wound of other specified part of neck, sequela Facility Procedures CPT4 Code: [id number] Description: 14431 - DEBRIDE WOUND 1ST 20 SQ CM OR < ICD-10 Diagnosis Description S11.80XS Unspecified open wound of other specified part of  neck, se Modifier: quela Quantity: 1 Physician Procedures CPT4 Code: [id number] Description: [place] - WC PHYS DEBR WO ANESTH 20 SQ CM ICD-10 Diagnosis Description S11.80XS Unspecified open wound of other specified part of neck, se Modifier: quela Quantity: 1 Electronic Signature(s) Signed: 05/10/2017 12:33:52 PM By: Lawanda Cousins Entered By: Lawanda Cousins on 05/10/2017 12:33:52

## 2017-05-15 NOTE — Progress Notes (Signed)
SHARLEEN, SZCZESNY ([id number]) Visit Report for 05/10/2017 Arrival Information Details Patient Name: Jane [name], Jane [name]. Date of Service: 05/10/2017 12:00 PM Medical Record Number: [id number] Patient Account Number: [id number] Date of Birth/Sex: 1950-09-30 (66 y.o. F) Treating RN: Montey Hora Primary Care Kaelene Elliston: Lavera Guise Other Clinician: Referring Michela Herst: Lavera Guise Treating Treston Coker/Extender: Cathie Olden in Treatment: 3 Visit Information History Since Last Visit Added or deleted any medications: No Patient Arrived: Ambulatory Any new allergies or adverse reactions: No Arrival Time: 11:51 Had [name] fall or experienced change in No Accompanied By: spouse activities of daily living that may affect Transfer Assistance: None risk of falls: Patient Identification Verified: Yes Signs or symptoms of abuse/neglect since last visito No Secondary Verification Process Completed: Yes Hospitalized since last visit: No Implantable device outside of the clinic excluding No cellular tissue based products placed in the center since last visit: Has Dressing in Place as Prescribed: Yes Pain Present Now: No Electronic Signature(s) Signed: 05/10/2017 1:59:05 PM By: Montey Hora Entered By: Montey Hora on 05/10/2017 11:52:40 Beamon, [name] ([id number]) -------------------------------------------------------------------------------- Encounter Discharge Information Details Patient Name: Jane [name]. Date of Service: 05/10/2017 12:00 PM Medical Record Number: [id number] Patient Account Number: [id number] Date of Birth/Sex: 25-Oct-1950 (66 y.o. F) Treating RN: Ahmed Prima Primary Care Jumaane Weatherford: Lavera Guise Other Clinician: Referring Wister Hoefle: Lavera Guise Treating Jeromy Borcherding/Extender: Cathie Olden in Treatment: 3 Encounter Discharge Information Items Discharge Pain Level: 0 Discharge Condition: Stable Ambulatory Status: Ambulatory Discharge Destination:  Home Transportation: Private Auto Schedule Follow-up Appointment: Yes Medication Reconciliation completed and No provided to Patient/Care Arnetia Bronk: Provided on Clinical Summary of Care: 05/10/2017 Form Type Recipient Paper Patient [facility] Electronic Signature(s) Signed: 05/10/2017 12:42:09 PM By: Roger Shelter Entered By: Roger Shelter on 05/10/2017 12:21:46 Mcmath, Denise [name]. ([id number]) -------------------------------------------------------------------------------- Lower Extremity Assessment Details Patient Name: Jane [name]. Date of Service: 05/10/2017 12:00 PM Medical Record Number: [id number] Patient Account Number: [id number] Date of Birth/Sex: Mar 26, 1950 (66 y.o. F) Treating RN: Montey Hora Primary Care Alys Dulak: Lavera Guise Other Clinician: Referring Hosanna Betley: Lavera Guise Treating Goebel Hellums/Extender: Cathie Olden in Treatment: 3 Electronic Signature(s) Signed: 05/10/2017 1:59:05 PM By: Montey Hora Entered By: Montey Hora on 05/10/2017 11:58:12 Cavalieri, Locust AMarland Kitchen ([id number]) -------------------------------------------------------------------------------- Multi Wound Chart Details Patient Name: Jane [name]. Date of Service: 05/10/2017 12:00 PM Medical Record Number: [id number] Patient Account Number: [id number] Date of Birth/Sex: [birth year] (66 y.o. F) Treating RN: Ahmed Prima Primary Care Milda Lindvall: Lavera Guise Other Clinician: Referring Lenor Provencher: Lavera Guise Treating Deloria Brassfield/Extender: Cathie Olden in Treatment: 3 Vital Signs Height(in): 69 Pulse(bpm): 73 Weight(lbs): 260 Blood Pressure(mmHg): 140/85 Body Mass Index(BMI): 38 Temperature(F): 98.3 Respiratory Rate 16 (breaths/min): Photos: [N/[name]:N/[name]] Wound Location: Neck - Posterior N/[name] N/[name] Wounding Event: Surgical Injury N/[name] N/[name] Primary Etiology: Open Surgical Wound N/[name] N/[name] Comorbid History: Osteoarthritis, Neuropathy N/[name] N/[name] Date Acquired: 03/26/2017 N/[name] N/[name] Weeks of Treatment: 3  N/[name] N/[name] Wound Status: Open N/[name] N/[name] Measurements L x W x D 0.5x0.2x0.1 N/[name] N/[name] (cm) Area (cm) : 0.079 N/[name] N/[name] Volume (cm) : 0.008 N/[name] N/[name] % Reduction in Area: 84.30% N/[name] N/[name] % Reduction in Volume: 97.40% N/[name] N/[name] Classification: Full Thickness Without N/[name] N/[name] Exposed Support Structures Exudate Amount: Medium N/[name] N/[name] Exudate Type: Serous N/[name] N/[name] Exudate Color: amber N/[name] N/[name] Wound Margin: Flat and Intact N/[name] N/[name] Granulation Amount: None Present (0%) N/[name] N/[name] Necrotic Amount: Large (67-100%) N/[name] N/[name] Exposed Structures: Fat Layer (Subcutaneous N/[name] N/[name] Tissue) Exposed: Yes Fascia: No Tendon: No Muscle: No Joint: No Bone: No Epithelialization: Small (1-33%)  N/[name] N/[name] UCHECHI, DENISON ([id number]) Debridement: Debridement - Excisional N/[name] N/[name] Pre-procedure 12:10 N/[name] N/[name] Verification/Time Out Taken: Pain Control: Lidocaine 4% Topical Solution N/[name] N/[name] Tissue Debrided: Subcutaneous, Slough N/[name] N/[name] Level: Skin/Subcutaneous Tissue N/[name] N/[name] Debridement Area (sq cm): 0.1 N/[name] N/[name] Instrument: Curette N/[name] N/[name] Bleeding: Minimum N/[name] N/[name] Hemostasis Achieved: Pressure N/[name] N/[name] Procedural Pain: 0 N/[name] N/[name] Post Procedural Pain: 0 N/[name] N/[name] Debridement Treatment Procedure was tolerated well N/[name] N/[name] Response: Post Debridement 0.5x0.2x0.2 N/[name] N/[name] Measurements L x W x D (cm) Post Debridement Volume: 0.016 N/[name] N/[name] (cm) Periwound Skin Texture: Excoriation: No N/[name] N/[name] Induration: No Callus: No Crepitus: No Rash: No Scarring: No Periwound Skin Moisture: Maceration: No N/[name] N/[name] Dry/Scaly: No Periwound Skin Color: Ecchymosis: Yes N/[name] N/[name] Atrophie Blanche: No Cyanosis: No Erythema: No Hemosiderin Staining: No Mottled: No Pallor: No Rubor: No Temperature: No Abnormality N/[name] N/[name] Tenderness on Palpation: No N/[name] N/[name] Wound Preparation: Ulcer Cleansing: N/[name] N/[name] Rinsed/Irrigated with Saline Topical Anesthetic Applied: None Procedures Performed: Debridement N/[name] N/[name] Treatment Notes Wound #1  (Posterior Neck) 1. Cleansed with: Clean wound with Normal Saline 2. Anesthetic Topical Lidocaine 4% cream to wound bed prior to debridement 3. Peri-wound Care: Skin Prep 4. Dressing Applied: Santyl Ointment 5. Secondary Dressing Applied Bordered Foam Dressing Firmin, Undine [name]. ([id number]) Dry Gauze Notes santyl, saline wet gauze dry gauze and boarder foam dressing Electronic Signature(s) Signed: 05/10/2017 12:32:16 PM By: Lawanda Cousins Entered By: Lawanda Cousins on 05/10/2017 12:32:16 [name] ([id number]) -------------------------------------------------------------------------------- [place] Details Patient Name: Jane [name]. Date of Service: 05/10/2017 12:00 PM Medical Record Number: [id number] Patient Account Number: [id number] Date of Birth/Sex: Jun 06, 1950 (66 y.o. F) Treating RN: Ahmed Prima Primary Care Foday Cone: Lavera Guise Other Clinician: Referring Mikhala Kenan: Lavera Guise Treating Alisah Grandberry/Extender: Cathie Olden in Treatment: 3 Active Inactive ` Orientation to the Wound Care Program Nursing Diagnoses: Knowledge deficit related to the wound healing center program Goals: Patient/caregiver will verbalize understanding of the [place] Program Date Initiated: 04/19/2017 Target Resolution Date: 05/19/2017 Goal Status: Active Interventions: Provide education on orientation to the wound center Notes: ` Wound/Skin Impairment Nursing Diagnoses: Impaired tissue integrity Knowledge deficit related to ulceration/compromised skin integrity Goals: Ulcer/skin breakdown will have [name] volume reduction of 80% by week 12 Date Initiated: 04/19/2017 Target Resolution Date: 08/11/2017 Goal Status: Active Interventions: Assess patient/caregiver ability to perform ulcer/skin care regimen upon admission and as needed Assess ulceration(s) every visit Notes: Electronic Signature(s) Signed: 05/11/2017 4:40:49 PM By: Alric Quan Entered By: Alric Quan on 05/10/2017 12:09:28 [name] ([id number]) -------------------------------------------------------------------------------- Pain Assessment Details Patient Name: Jane [name]. Date of Service: 05/10/2017 12:00 PM Medical Record Number: [id number] Patient Account Number: [id number] Date of Birth/Sex: Aug 10, 1950 (66 y.o. F) Treating RN: Montey Hora Primary Care Donnah Levert: Lavera Guise Other Clinician: Referring Fairley Copher: Lavera Guise Treating Dashawna Delbridge/Extender: Cathie Olden in Treatment: 3 Active Problems Location of Pain Severity and Description of Pain Patient Has Paino No Site Locations Pain Management and Medication Current Pain Management: Electronic Signature(s) Signed: 05/10/2017 1:59:05 PM By: Montey Hora Entered By: Montey Hora on 05/10/2017 11:53:14 Craw, [name] ([id number]) -------------------------------------------------------------------------------- Patient/Caregiver Education Details Patient Name: Jane [name]. Date of Service: 05/10/2017 12:00 PM Medical Record Number: [id number] Patient Account Number: [id number] Date of Birth/Gender: 07/28/1950 (67 y.o. F) Treating RN: Roger Shelter Primary Care Physician: Lavera Guise Other Clinician: Referring Physician: Lavera Guise Treating Physician/Extender: Cathie Olden in Treatment: 3 Education Assessment Education Provided To: Patient and Caregiver Education Topics Provided Wound/Skin  Impairment: Handouts: Caring for Your Ulcer Methods: Explain/Verbal Responses: State content correctly Electronic Signature(s) Signed: 05/10/2017 12:42:09 PM By: Roger Shelter Entered By: Roger Shelter on 05/10/2017 12:21:59 Tugman, Philomene [name]. ([id number]) -------------------------------------------------------------------------------- Wound Assessment Details Patient Name: Jane [name]. Date of Service: 05/10/2017 12:00 PM Medical Record Number:  [id number] Patient Account Number: [id number] Date of Birth/Sex: 1950-05-28 (66 y.o. F) Treating RN: Montey Hora Primary Care Pailyn Bellevue: Lavera Guise Other Clinician: Referring Sreshta Cressler: Lavera Guise Treating Litha Lamartina/Extender: Cathie Olden in Treatment: 3 Wound Status Wound Number: 1 Primary Etiology: Open Surgical Wound Wound Location: Neck - Posterior Wound Status: Open Wounding Event: Surgical Injury Comorbid History: Osteoarthritis, Neuropathy Date Acquired: 03/26/2017 Weeks Of Treatment: 3 Clustered Wound: No Photos Photo Uploaded By: Montey Hora on 05/10/2017 12:02:32 Wound Measurements Length: (cm) 0.5 Width: (cm) 0.2 Depth: (cm) 0.1 Area: (cm) 0.079 Volume: (cm) 0.008 % Reduction in Area: 84.3% % Reduction in Volume: 97.4% Epithelialization: Small (1-33%) Tunneling: No Undermining: No Wound Description Full Thickness Without Exposed Support Classification: Structures Wound Margin: Flat and Intact Exudate Medium Amount: Exudate Type: Serous Exudate Color: amber Foul Odor After Cleansing: No Slough/Fibrino Yes Wound Bed Granulation Amount: None Present (0%) Exposed Structure Necrotic Amount: Large (67-100%) Fascia Exposed: No Necrotic Quality: Adherent Slough Fat Layer (Subcutaneous Tissue) Exposed: Yes Tendon Exposed: No Muscle Exposed: No Joint Exposed: No Bone Exposed: No Iyengar, Emylee [name]. ([id number]) Periwound Skin Texture Texture Color No Abnormalities Noted: No No Abnormalities Noted: No Callus: No Atrophie Blanche: No Crepitus: No Cyanosis: No Excoriation: No Ecchymosis: Yes Induration: No Erythema: No Rash: No Hemosiderin Staining: No Scarring: No Mottled: No Pallor: No Moisture Rubor: No No Abnormalities Noted: No Dry / Scaly: No Temperature / Pain Maceration: No Temperature: No Abnormality Wound Preparation Ulcer Cleansing: Rinsed/Irrigated with Saline Topical Anesthetic Applied: None Treatment Notes Wound #1  (Posterior Neck) 1. Cleansed with: Clean wound with Normal Saline 2. Anesthetic Topical Lidocaine 4% cream to wound bed prior to debridement 3. Peri-wound Care: Skin Prep 4. Dressing Applied: Santyl Ointment 5. Secondary Dressing Applied Bordered Foam Dressing Dry Gauze Notes santyl, saline wet gauze dry gauze and boarder foam dressing Electronic Signature(s) Signed: 05/10/2017 1:59:05 PM By: Montey Hora Entered By: Montey Hora on 05/10/2017 11:58:03 Taber, Brynlee [name]. ([id number]) -------------------------------------------------------------------------------- Vitals Details Patient Name: Jane [name]. Date of Service: 05/10/2017 12:00 PM Medical Record Number: [id number] Patient Account Number: [id number] Date of Birth/Sex: Mar 27, 1950 (66 y.o. F) Treating RN: Montey Hora Primary Care Tallon Gertz: Lavera Guise Other Clinician: Referring Charmian Forbis: Lavera Guise Treating Kimaya Whitlatch/Extender: Cathie Olden in Treatment: 3 Vital Signs Time Taken: 11:53 Temperature (F): 98.3 Height (in): 69 Pulse (bpm): 77 Weight (lbs): 260 Respiratory Rate (breaths/min): 16 Body Mass Index (BMI): 38.4 Blood Pressure (mmHg): 140/85 Reference Range: 80 - 120 mg / dl Electronic Signature(s) Signed: 05/10/2017 1:59:05 PM By: Montey Hora Entered By: Montey Hora on 05/10/2017 11:54:17

## 2017-05-17 ENCOUNTER — Ambulatory Visit: Payer: Medicare Other | Admitting: Physician Assistant

## 2017-05-24 ENCOUNTER — Encounter: Payer: Medicare Other | Admitting: Nurse Practitioner

## 2017-05-24 DIAGNOSIS — T8131XA Disruption of external operation (surgical) wound, not elsewhere classified, initial encounter: Secondary | ICD-10-CM | POA: Diagnosis not present

## 2017-05-27 NOTE — Progress Notes (Signed)
NISSA, STANNARD ([id number]) Visit Report for 05/24/2017 Chief Complaint Document Details Patient Name: Jane [name], [name] Date of Service: 05/24/2017 8:00 AM Medical Record Number: [id number] Patient Account Number: [id number] Date of Birth/Sex: 1950-09-17 (66 y.o. F) Treating RN: Ahmed Prima Primary Care Provider: Lavera Guise Other Clinician: Referring Provider: Lavera Guise Treating Provider/Extender: Cathie Olden in Treatment: 5 Information Obtained from: Patient Chief Complaint She is here for evaluation of a cervical neck surgical site dehiscence Electronic Signature(s) Signed: 05/24/2017 8:18:41 AM By: Lawanda Cousins Entered By: Lawanda Cousins on 05/24/2017 08:18:41 [name] ([id number]) -------------------------------------------------------------------------------- HPI Details Patient Name: Jane Bouche A. Date of Service: 05/24/2017 8:00 AM Medical Record Number: [id number] Patient Account Number: [id number] Date of Birth/Sex: [birth year] (66 y.o. F) Treating RN: Ahmed Prima Primary Care Provider: Lavera Guise Other Clinician: Referring Provider: Lavera Guise Treating Provider/Extender: Cathie Olden in Treatment: 5 History of Present Illness HPI Description: 04/19/17-She is here for evaluation of a cervical neck wound dehiscence. 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 Sutures were removed on 4/4 there is no disc noted concerned and the crevice of the posterior cervical spine. She'll follow-up appointment on 4/9 where she was prescribed Augmentin for 2 weeks and consult to wound clinic. She presents with limited range of motion to her neck which is not new postoperatively. There is a small area of nonviable tissue and a natural crevice in her posterior neck. We will treat with santyl, they are to call if unable to afford. She will follow up next week. 04/26/17- She is here in follow-up  evaluation for cervical neck wound. She has an appointment with Dr. Cari Caraway next Thursday. She states that the Augmentin prescribed by the surgeon on 4/9 was supposed to be for 2 weeks but they were only provided with enough medication to take for 1 week; they have been encouraged to contact the surgeon's office regarding this discrepancy. They were able to get the Santyl but there was a delay at the pharmacy they were unable to pick it up until Monday. There is improvement in the slough consistency, otherwise no significant change. She will continue the same treatment plan and follow up next week. 05/03/17-she is here in follow-up evaluation for cervical neck wound. She has an appointment with neurosurgery after this appointment. She completed her Augmentin today. There is new visible areas of depth to the most proximal and distal aspect. We will continue with Santyl for 1-2 weeks and anticipate transitioning to collagen product at that time. 05/10/17-She is here in follow up evaluation for cervical neck wound. she saw neurosurgery last week with no change in treatment plan. We will continue with Santyl and she will follow-up in 2 weeks. 05/24/17-She is here in follow-up evaluation for cervical neck wound. The area is epithelialized with no evidence of drainage. She will be discharged from clinic today Electronic Signature(s) Signed: 05/24/2017 8:22:01 AM By: Lawanda Cousins Entered By: Lawanda Cousins on 05/24/2017 08:22:01 [name] ([id number]) -------------------------------------------------------------------------------- Physician Orders Details Patient Name: Jane Bouche A. Date of Service: 05/24/2017 8:00 AM Medical Record Number: [id number] Patient Account Number: [id number] Date of Birth/Sex: [birth year] (66 y.o. F) Treating RN: Ahmed Prima Primary Care Provider: Lavera Guise Other Clinician: Referring Provider: Lavera Guise Treating Provider/Extender: Cathie Olden in  Treatment: 5 Verbal / Phone Orders: Yes Clinician: Carolyne Fiscal, Debi Read Back and Verified: Yes Diagnosis Coding Discharge From [facility] o Discharge from [place] - Please  call our office if you have any questions or concerns. Electronic Signature(s) Signed: 05/24/2017 5:24:28 PM By: Lawanda Cousins Signed: 05/25/2017 4:38:26 PM By: Alric Quan Entered By: Alric Quan on 05/24/2017 08:14:14 [name] ([id number]) -------------------------------------------------------------------------------- Problem List Details Patient Name: Jane Bouche A. Date of Service: 05/24/2017 8:00 AM Medical Record Number: [id number] Patient Account Number: [id number] Date of Birth/Sex: 1950-01-26 (66 y.o. 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 ([id number]) -------------------------------------------------------------------------------- Progress Note Details Patient Name: Jane Bouche A. Date of Service: 05/24/2017 8:00 AM Medical Record Number: [id number] Patient Account Number: [id number] Date of Birth/Sex: [birth year] (66 y.o. F) Treating RN: Ahmed Prima Primary Care Provider: Lavera Guise Other Clinician: Referring Provider: Lavera Guise Treating Provider/Extender: Cathie Olden in Treatment: 5 Subjective Chief  Complaint Information obtained from Patient She is here for evaluation of a cervical neck surgical site dehiscence History of Present Illness (HPI) 04/19/17-She is here for evaluation of a cervical neck wound dehiscence. 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 Sutures were removed on 4/4 there is no disc noted concerned and the crevice of the posterior cervical spine. She'll follow-up appointment on 4/9 where she was prescribed Augmentin for 2 weeks and consult to wound clinic. She presents with limited range of motion to her neck which is not new postoperatively. There is a small area of nonviable tissue and a natural crevice in her posterior neck. We will treat with santyl, they are to call if unable to afford. She will follow up next week. 04/26/17- She is here in follow-up evaluation for cervical neck wound. She has an appointment with Dr. Cari Caraway next Thursday. She states that the Augmentin prescribed by the surgeon on 4/9 was supposed to be for 2 weeks but they were only provided with enough medication to take for 1 week; they have been encouraged to contact the surgeon's office regarding this discrepancy. They were able to get the Santyl but there was a delay at the pharmacy they were unable to pick it up until Monday. There is improvement in the slough consistency, otherwise no significant change. She will continue the same treatment plan and follow up next week. 05/03/17-she is here in follow-up evaluation for cervical neck wound. She has an appointment with neurosurgery after this appointment. She completed her Augmentin today. There is new visible areas of depth to the most proximal and distal aspect. We will continue with Santyl for 1-2 weeks and anticipate transitioning to collagen product at that time. 05/10/17-She is here in follow up evaluation for cervical neck wound. she saw neurosurgery last week with no change  in treatment plan. We will continue with Santyl and she will follow-up in 2 weeks. 05/24/17-She is here in follow-up evaluation for cervical neck wound. The area is epithelialized with no evidence of drainage. She will be discharged from clinic today Patient History Information obtained from Patient. Family History Lung Disease - Father, No family history of Cancer, Diabetes, Heart Disease, Hereditary  Spherocytosis, Hypertension, Kidney Disease, Seizures, Stroke, Thyroid Problems, Tuberculosis. Social History Never smoker, Marital Status - Married, Alcohol Use - Rarely, Drug Use - No History, Caffeine Use - Daily. Medical And Surgical History Notes Musculoskeletal spinal cord mass removed and screws inserted March 26 2017 Jane [name], [name] ([id number]) Objective Constitutional Vitals Time Taken: 8:07 AM, Height: 69 in, Weight: 260 lbs, BMI: 38.4, Temperature: 98.1 F, Pulse: 83 bpm, Respiratory Rate: 16 breaths/min, Blood Pressure: 136/80 mmHg. Integumentary (Hair, Skin) Wound #1 status is Healed - Epithelialized. Original cause of wound was Surgical Injury. The wound is located on the Posterior Neck. The wound measures 0cm length x 0cm width x 0cm depth; 0cm^2 area and 0cm^3 volume. There is Fat Layer (Subcutaneous Tissue) Exposed exposed. There is no tunneling or undermining noted. There is a none present amount of drainage noted. The wound margin is flat and intact. There is large (67-100%) pink granulation within the wound bed. There is no necrotic tissue within the wound bed. The periwound skin appearance exhibited: Ecchymosis. The periwound skin appearance did not exhibit: Callus, Crepitus, Excoriation, Induration, Rash, Scarring, Dry/Scaly, Maceration, Atrophie Blanche, Cyanosis, Hemosiderin Staining, Mottled, Pallor, Rubor, Erythema. Periwound temperature was noted as No Abnormality. 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 Electronic Signature(s) Signed: 05/24/2017 8:22:21 AM By: Lawanda Cousins Entered By: Lawanda Cousins on 05/24/2017 08:22:21 [name] ([id number]) -------------------------------------------------------------------------------- ROS/PFSH Details Patient Name: Jane Bouche A. Date of Service: 05/24/2017 8:00 AM Medical Record Number: [id number] Patient Account Number: [id number] Date of Birth/Sex: 1950-06-08 (66 y.o. F) Treating RN: Ahmed Prima Primary Care Provider: Lavera Guise Other Clinician: Referring Provider: Lavera Guise Treating Provider/Extender: Cathie Olden in Treatment: 5 Information Obtained From Patient Wound History Do you currently have one or more open woundso Yes How many open wounds do you currently haveo 1 Approximately how long have you had your woundso 3 weeks How have you been treating your wound(s) until nowo open to air Has your wound(s) ever healed and then re-openedo No Have you had any lab work done in the past montho No Have you tested positive for an antibiotic resistant organism (MRSA, VRE)o No Have you tested positive for osteomyelitis (bone infection)o No Have you had any tests for circulation on your legso No Hematologic/Lymphatic Medical History: Negative for: Anemia; Hemophilia; Human Immunodeficiency Virus; Lymphedema; Sickle Cell Disease Respiratory Medical History: Negative for: Aspiration; Asthma; Chronic Obstructive Pulmonary Disease (COPD); Pneumothorax; Sleep Apnea; Tuberculosis Cardiovascular Medical History: Negative for: Angina; Arrhythmia; Congestive Heart Failure; Coronary Artery Disease; Deep Vein Thrombosis; Hypertension; Hypotension; Myocardial Infarction;  Peripheral Arterial Disease; Peripheral Venous Disease; Phlebitis; Vasculitis Gastrointestinal Medical History: Negative for: Cirrhosis ; Colitis; Crohnos; Hepatitis A; Hepatitis B; Hepatitis C Endocrine Medical History: Negative for: Type I Diabetes; Type II Diabetes Immunological Medical History: Negative for: Lupus Erythematosus; Raynaudos; Scleroderma Musculoskeletal Medical History: Positive for: Osteoarthritis Jane [name], [name] ([id number]) Negative for: Gout; Rheumatoid Arthritis; Osteomyelitis Past Medical History Notes: spinal cord mass removed and screws inserted March 26 2017 Neurologic Medical History: Positive for: Neuropathy - right hand Negative for: Dementia; Quadriplegia; Paraplegia; Seizure Disorder Oncologic Medical History: Negative for: Received Chemotherapy; Received Radiation Immunizations Pneumococcal Vaccine: Received Pneumococcal Vaccination: Yes Immunization Notes: up to date Implantable Devices Family  and Social History Cancer: No; Diabetes: No; Heart Disease: No; Hereditary Spherocytosis: No; Hypertension: No; Kidney Disease: No; Lung Disease: Yes - Father; Seizures: No; Stroke: No; Thyroid Problems: No; Tuberculosis: No; Never smoker; Marital Status - Married; Alcohol Use: Rarely; Drug Use: No History; Caffeine Use: Daily; Financial Concerns: No; Food, Clothing or Shelter Needs: No; Support System Lacking: No; Transportation Concerns: No; Advanced Directives: No; Patient does not want information on Advanced Directives Physician Affirmation I have reviewed and agree with the above information. Electronic Signature(s) Signed: 05/24/2017 5:24:28 PM By: Lawanda Cousins Signed: 05/25/2017 4:38:26 PM By: Alric Quan Entered By: Lawanda Cousins on 05/24/2017 08:22:09 [name] ([id number]) -------------------------------------------------------------------------------- SuperBill Details Patient Name: Jane Bouche A. Date of Service:  05/24/2017 Medical Record Number: [id number] Patient Account Number: [id number] Date of Birth/Sex: November 11, 1950 (67 y.o. 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 G95.9 Disease of spinal cord, unspecified S11.80XS Unspecified open wound of other specified part of neck, sequela Facility Procedures CPT4 Code: [id number] Description: [phone] - WOUND CARE VISIT-LEV 2 EST PT Modifier: Quantity: 1 Physician Procedures CPT4 Code: [id number] Description: 27078 - WC PHYS LEVEL 2 - EST PT ICD-10 Diagnosis Description M75.4 Neoplasm of uncertain behavior of bone and articular car S11.80XS Unspecified open wound of other specified part of neck, Modifier: tilage sequela Quantity: 1 Electronic Signature(s) Signed: 05/24/2017 8:59:02 AM By: Alric Quan Signed: 05/24/2017 5:24:28 PM By: Lawanda Cousins Previous Signature: 05/24/2017 8:22:34 AM Version By: Lawanda Cousins Entered By: Alric Quan on 05/24/2017 08:59:02

## 2017-05-27 NOTE — Progress Notes (Signed)
JAZARIA, JARECKI ([id number]) Visit Report for 05/24/2017 Arrival Information Details Patient Name: AYELET, GRUENEWALD A. Date of Service: 05/24/2017 8:00 AM Medical Record Number: [id number] Patient Account Number: [id number] Date of Birth/Sex: June 19, 1950 (67 y.o. F) Treating RN: Montey Hora Primary Care Tajai Ihde: Lavera Guise Other Clinician: Referring Danai Gotto: Lavera Guise Treating Ninfa Giannelli/Extender: Cathie Olden in Treatment: 5 Visit Information History Since Last Visit Added or deleted any medications: No Patient Arrived: Ambulatory Any new allergies or adverse reactions: No Arrival Time: 08:07 Had a fall or experienced change in No Accompanied By: spouse activities of daily living that may affect Transfer Assistance: None risk of falls: Patient Identification Verified: Yes Signs or symptoms of abuse/neglect since last visito No Secondary Verification Process Completed: Yes Hospitalized since last visit: No Implantable device outside of the clinic excluding No cellular tissue based products placed in the center since last visit: Has Dressing in Place as Prescribed: Yes Pain Present Now: No Electronic Signature(s) Signed: 05/24/2017 4:44:42 PM By: Montey Hora Entered By: Montey Hora on 05/24/2017 08:07:38 Ruggles, [name] ([id number]) -------------------------------------------------------------------------------- Clinic Level of Care Assessment Details Patient Name: [name] Date of Service: 05/24/2017 8:00 AM Medical Record Number: [id number] Patient Account Number: [id number] Date of Birth/Sex: Jul 29, 1950 (67 y.o. F) Treating RN: Ahmed Prima Primary Care Jasmon Mattice: Lavera Guise Other Clinician: Referring Veverly Larimer: Lavera Guise Treating Lakoda Raske/Extender: Cathie Olden in Treatment: 5 Clinic Level of Care Assessment Items TOOL 4 Quantity Score X - Use when only an EandM is performed on FOLLOW-UP visit 1 0 ASSESSMENTS - Nursing Assessment /  Reassessment X - Reassessment of Co-morbidities (includes updates in patient status) 1 10 X- 1 5 Reassessment of Adherence to Treatment Plan ASSESSMENTS - Wound and Skin Assessment / Reassessment X - Simple Wound Assessment / Reassessment - one wound 1 5 []  - 0 Complex Wound Assessment / Reassessment - multiple wounds []  - 0 Dermatologic / Skin Assessment (not related to wound area) ASSESSMENTS - Focused Assessment []  - Circumferential Edema Measurements - multi extremities 0 []  - 0 Nutritional Assessment / Counseling / Intervention []  - 0 Lower Extremity Assessment (monofilament, tuning fork, pulses) []  - 0 Peripheral Arterial Disease Assessment (using hand held doppler) ASSESSMENTS - Ostomy and/or Continence Assessment and Care []  - Incontinence Assessment and Management 0 []  - 0 Ostomy Care Assessment and Management (repouching, etc.) PROCESS - Coordination of Care X - Simple Patient / Family Education for ongoing care 1 15 []  - 0 Complex (extensive) Patient / Family Education for ongoing care []  - 0 Staff obtains Programmer, systems, Records, Test Results / Process Orders []  - 0 Staff telephones HHA, Nursing Homes / Clarify orders / etc []  - 0 Routine Transfer to another Facility (non-emergent condition) []  - 0 Routine Hospital Admission (non-emergent condition) []  - 0 New Admissions / Biomedical engineer / Ordering NPWT, Apligraf, etc. []  - 0 Emergency Hospital Admission (emergent condition) X- 1 10 Simple Discharge Coordination Berne, Nayda A. ([id number]) []  - 0 Complex (extensive) Discharge Coordination PROCESS - Special Needs []  - Pediatric / Minor Patient Management 0 []  - 0 Isolation Patient Management []  - 0 Hearing / Language / Visual special needs []  - 0 Assessment of Community assistance (transportation, D/C planning, etc.) []  - 0 Additional assistance / Altered mentation []  - 0 Support Surface(s) Assessment (bed, cushion, seat, etc.) INTERVENTIONS -  Wound Cleansing / Measurement X - Simple Wound Cleansing - one wound 1 5 []  - 0 Complex Wound Cleansing - multiple wounds X- 1 5 Wound Imaging (photographs -  any number of wounds) []  - 0 Wound Tracing (instead of photographs) []  - 0 Simple Wound Measurement - one wound []  - 0 Complex Wound Measurement - multiple wounds INTERVENTIONS - Wound Dressings []  - Small Wound Dressing one or multiple wounds 0 []  - 0 Medium Wound Dressing one or multiple wounds []  - 0 Large Wound Dressing one or multiple wounds []  - 0 Application of Medications - topical []  - 0 Application of Medications - injection INTERVENTIONS - Miscellaneous []  - External ear exam 0 []  - 0 Specimen Collection (cultures, biopsies, blood, body fluids, etc.) []  - 0 Specimen(s) / Culture(s) sent or taken to Lab for analysis []  - 0 Patient Transfer (multiple staff / Civil Service fast streamer / Similar devices) []  - 0 Simple Staple / Suture removal (25 or less) []  - 0 Complex Staple / Suture removal (26 or more) []  - 0 Hypo / Hyperglycemic Management (close monitor of Blood Glucose) []  - 0 Ankle / Brachial Index (ABI) - do not check if billed separately X- 1 5 Vital Signs Lashomb, Marshae A. ([id number]) Has the patient been seen at the hospital within the last three years: Yes Total Score: 60 Level Of Care: New/Established - Level 2 Electronic Signature(s) Signed: 05/25/2017 4:38:26 PM By: Alric Quan Entered By: Alric Quan on 05/24/2017 08:58:53 Edgett, [name] ([id number]) -------------------------------------------------------------------------------- Encounter Discharge Information Details Patient Name: [name] Date of Service: 05/24/2017 8:00 AM Medical Record Number: [id number] Patient Account Number: [id number] Date of Birth/Sex: 08-Oct-1950 (67 y.o. F) Treating RN: Ahmed Prima Primary Care Woods Gangemi: Lavera Guise Other Clinician: Referring Brighton Delio: Lavera Guise Treating Bryanda Mikel/Extender:  Cathie Olden in Treatment: 5 Encounter Discharge Information Items Discharge Condition: Stable Ambulatory Status: Ambulatory Discharge Destination: Home Transportation: Private Auto Accompanied By: husband Schedule Follow-up Appointment: No Clinical Summary of Care: Electronic Signature(s) Signed: 05/25/2017 4:38:26 PM By: Alric Quan Entered By: Alric Quan on 05/24/2017 08:14:32 Cherubini, [name] ([id number]) -------------------------------------------------------------------------------- Lower Extremity Assessment Details Patient Name: [name] Date of Service: 05/24/2017 8:00 AM Medical Record Number: [id number] Patient Account Number: [id number] Date of Birth/Sex: 1950-03-29 (67 y.o. F) Treating RN: Montey Hora Primary Care Adalbert Alberto: Lavera Guise Other Clinician: Referring Velma Agnes: Lavera Guise Treating Macguire Holsinger/Extender: Cathie Olden in Treatment: 5 Electronic Signature(s) Signed: 05/24/2017 4:44:42 PM By: Montey Hora Entered By: Montey Hora on 05/24/2017 08:10:52 Csaszar, [name] ([id number]) -------------------------------------------------------------------------------- Multi Wound Chart Details Patient Name: [name] Date of Service: 05/24/2017 8:00 AM Medical Record Number: [id number] Patient Account Number: [id number] Date of Birth/Sex: 1950/05/19 (67 y.o. F) Treating RN: Ahmed Prima Primary Care Maitland Muhlbauer: Lavera Guise Other Clinician: Referring Leslea Vowles: Lavera Guise Treating Yobani Schertzer/Extender: Cathie Olden in Treatment: 5 Vital Signs Height(in): 69 Pulse(bpm): 14 Weight(lbs): 260 Blood Pressure(mmHg): 136/80 Body Mass Index(BMI): 38 Temperature(F): 98.1 Respiratory Rate 16 (breaths/min): Photos: [1:No Photos] [N/A:N/A] Wound Location: [1:Posterior Neck] [N/A:N/A] Wounding Event: [1:Surgical Injury] [N/A:N/A] Primary Etiology: [1:Open Surgical Wound] [N/A:N/A] Comorbid History: [1:Osteoarthritis,  Neuropathy] [N/A:N/A] Date Acquired: [1:03/26/2017] [N/A:N/A] Weeks of Treatment: [1:5] [N/A:N/A] Wound Status: [1:Healed - Epithelialized] [N/A:N/A] Measurements L x W x D [1:0x0x0] [N/A:N/A] (cm) Area (cm) : [1:0] [N/A:N/A] Volume (cm) : [1:0] [N/A:N/A] % Reduction in Area: [1:100.00%] [N/A:N/A] % Reduction in Volume: [1:100.00%] [N/A:N/A] Classification: [1:Full Thickness Without Exposed Support Structures] [N/A:N/A] Exudate Amount: [1:None Present] [N/A:N/A] Wound Margin: [1:Flat and Intact] [N/A:N/A] Granulation Amount: [1:Large (67-100%)] [N/A:N/A] Granulation Quality: [1:Pink] [N/A:N/A] Necrotic Amount: [1:None Present (0%)] [N/A:N/A] Exposed Structures: [1:Fat Layer (Subcutaneous Tissue) Exposed: Yes Fascia: No Tendon: No Muscle: No Joint:  No Bone: No] [N/A:N/A] Epithelialization: [1:Large (67-100%)] [N/A:N/A] Periwound Skin Texture: [1:Excoriation: No Induration: No Callus: No Crepitus: No Rash: No Scarring: No] [N/A:N/A] Periwound Skin Moisture: [1:Maceration: No Dry/Scaly: No] [N/A:N/A] Periwound Skin Color: Ecchymosis: Yes N/A N/A Atrophie Blanche: No Cyanosis: No Erythema: No Hemosiderin Staining: No Mottled: No Pallor: No Rubor: No Temperature: No Abnormality N/A N/A Tenderness on Palpation: No N/A N/A Wound Preparation: Ulcer Cleansing: N/A N/A Rinsed/Irrigated with Saline Topical Anesthetic Applied: None Treatment Notes Electronic Signature(s) Signed: 05/24/2017 8:18:31 AM By: Lawanda Cousins Entered By: Lawanda Cousins on 05/24/2017 08:18:31 [name] ([id number]) -------------------------------------------------------------------------------- [place] Details Patient Name: [name] Date of Service: 05/24/2017 8:00 AM Medical Record Number: [id number] Patient Account Number: [id number] Date of Birth/Sex: Apr 19, 1950 (67 y.o. F) Treating RN: Ahmed Prima Primary Care Shon Indelicato: Lavera Guise Other Clinician: Referring  Rome Schlauch: Lavera Guise Treating Hilja Kintzel/Extender: Cathie Olden in Treatment: 5 Active Inactive Electronic Signature(s) Signed: 05/25/2017 4:38:26 PM By: Alric Quan Entered By: Alric Quan on 05/24/2017 08:15:08 [name] ([id number]) -------------------------------------------------------------------------------- Pain Assessment Details Patient Name: [name] Date of Service: 05/24/2017 8:00 AM Medical Record Number: [id number] Patient Account Number: [id number] Date of Birth/Sex: 07-24-1950 (67 y.o. F) Treating RN: Montey Hora Primary Care Julie Paolini: Lavera Guise Other Clinician: Referring Avir Deruiter: Lavera Guise Treating Saed Hudlow/Extender: Cathie Olden in Treatment: 5 Active Problems Location of Pain Severity and Description of Pain Patient Has Paino No Site Locations Pain Management and Medication Current Pain Management: Electronic Signature(s) Signed: 05/24/2017 4:44:42 PM By: Montey Hora Entered By: Montey Hora on 05/24/2017 08:07:44 [name] ([id number]) -------------------------------------------------------------------------------- Patient/Caregiver Education Details Patient Name: [name] Date of Service: 05/24/2017 8:00 AM Medical Record Number: [id number] Patient Account Number: [id number] Date of Birth/Gender: 09-06-1950 (67 y.o. F) Treating RN: Ahmed Prima Primary Care Physician: Lavera Guise Other Clinician: Referring Physician: Lavera Guise Treating Physician/Extender: Cathie Olden in Treatment: 5 Education Assessment Education Provided To: Patient Education Topics Provided Wound/Skin Impairment: Handouts: Skin Care Do's and Dont's, Other: Please cal our office if you have any questions or concerns. Methods: Explain/Verbal Responses: State content correctly Electronic Signature(s) Signed: 05/25/2017 4:38:26 PM By: Alric Quan Entered By: Alric Quan on 05/24/2017  08:14:45 [name] ([id number]) -------------------------------------------------------------------------------- Wound Assessment Details Patient Name: [name] Date of Service: 05/24/2017 8:00 AM Medical Record Number: [id number] Patient Account Number: [id number] Date of Birth/Sex: [birth year] (67 y.o. F) Treating RN: Ahmed Prima Primary Care Kymari Lollis: Lavera Guise Other Clinician: Referring Toy Eisemann: Lavera Guise Treating Mashanda Ishibashi/Extender: Cathie Olden in Treatment: 5 Wound Status Wound Number: 1 Primary Etiology: Open Surgical Wound Wound Location: Posterior Neck Wound Status: Healed - Epithelialized Wounding Event: Surgical Injury Comorbid History: Osteoarthritis, Neuropathy Date Acquired: 03/26/2017 Weeks Of Treatment: 5 Clustered Wound: No Wound Measurements Length: (cm) 0 % Reduct Width: (cm) 0 % Reduct Depth: (cm) 0 Epitheli Area: (cm) 0 Tunneli Volume: (cm) 0 Undermi ion in Area: 100% ion in Volume: 100% alization: Large (67-100%) ng: No ning: No Wound Description Full Thickness Without Exposed Support Foul Od Classification: Structures Slough/ Wound Margin: Flat and Intact Exudate None Present Amount: or After Cleansing: No Fibrino Yes Wound Bed Granulation Amount: Large (67-100%) Exposed Structure Granulation Quality: Pink Fascia Exposed: No Necrotic Amount: None Present (0%) Fat Layer (Subcutaneous Tissue) Exposed: Yes Tendon Exposed: No Muscle Exposed: No Joint Exposed: No Bone Exposed: No Periwound Skin Texture Texture Color No Abnormalities Noted: No No Abnormalities Noted: No Callus: No Atrophie Blanche: No Crepitus: No Cyanosis: No Excoriation: No  Ecchymosis: Yes Induration: No Erythema: No Rash: No Hemosiderin Staining: No Scarring: No Mottled: No Pallor: No Moisture Rubor: No No Abnormalities Noted: No Dry / Scaly: No Temperature / Pain Maceration: No Temperature: No Abnormality Kozloski, Royetta A.  ([id number]) Wound Preparation Ulcer Cleansing: Rinsed/Irrigated with Saline Topical Anesthetic Applied: None Electronic Signature(s) Signed: 05/25/2017 4:38:26 PM By: Alric Quan Entered By: Alric Quan on 05/24/2017 08:13:29 [name] ([id number]) -------------------------------------------------------------------------------- Vitals Details Patient Name: [name] Date of Service: 05/24/2017 8:00 AM Medical Record Number: [id number] Patient Account Number: [id number] Date of Birth/Sex: 1950/05/03 (67 y.o. F) Treating RN: Montey Hora Primary Care Sharyah Bostwick: Lavera Guise Other Clinician: Referring Dia Jefferys: Lavera Guise Treating Zyara Riling/Extender: Cathie Olden in Treatment: 5 Vital Signs Time Taken: 08:07 Temperature (F): 98.1 Height (in): 69 Pulse (bpm): 83 Weight (lbs): 260 Respiratory Rate (breaths/min): 16 Body Mass Index (BMI): 38.4 Blood Pressure (mmHg): 136/80 Reference Range: 80 - 120 mg / dl Electronic Signature(s) Signed: 05/24/2017 4:44:42 PM By: Montey Hora Entered By: Montey Hora on 05/24/2017 08:08:46

## 2019-09-16 IMAGING — CT CT ABD-PELV W/ CM
2 of 5 series · 13 of 36 positions shown, 16 images · IV contrast (iopamidol)
Comparison: Cervical MRI 03/13/2017

CLINICAL DATA: Spinal cord mass

EXAM:
CT CHEST, ABDOMEN, AND PELVIS WITH CONTRAST
TECHNIQUE: Multidetector CT imaging of the chest, abdomen and pelvis was
performed following the standard protocol during bolus
administration of intravenous contrast.
CONTRAST:  100mL 18LCWX-3HH IOPAMIDOL (18LCWX-3HH) INJECTION 61%

[Series 2: axials cap (person_name) · axial · 0.81mm/px · z∈[-1460,-930]mm · 10 of 130 slices shown, 13 images]
[im 12/130  mediastinal]
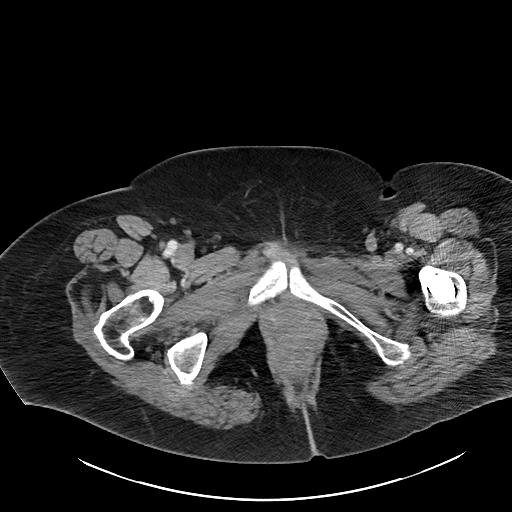
[im 12/130  lung]
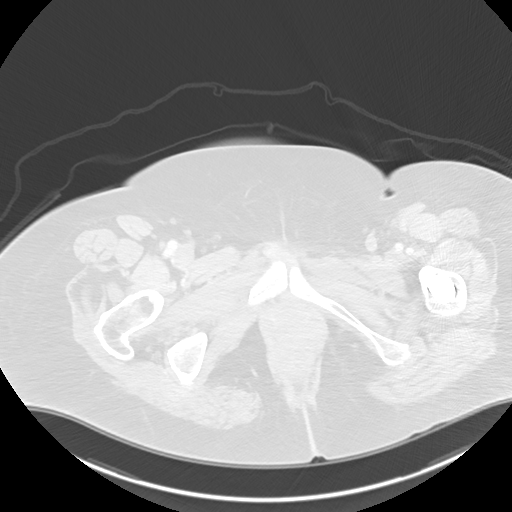
[im 24/130  lung]
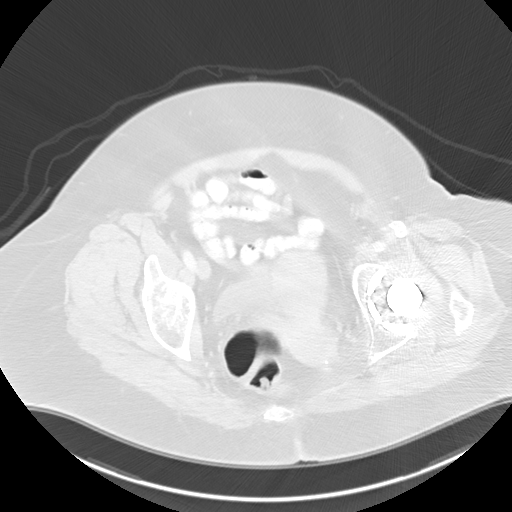
[im 36/130  lung]
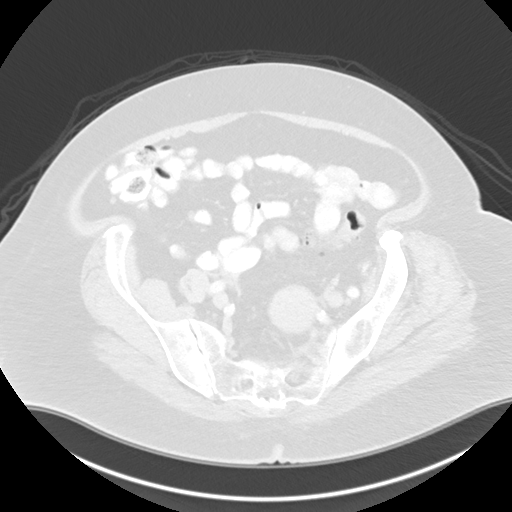
[im 47/130  lung]
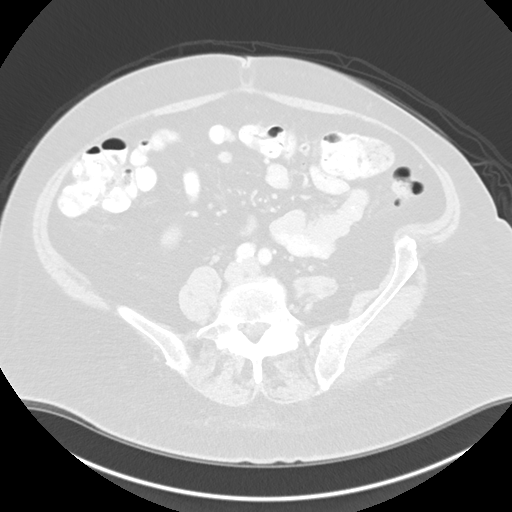
[im 59/130  mediastinal]
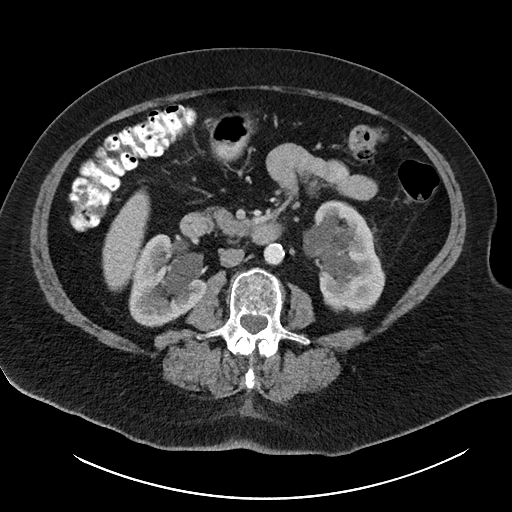
[im 59/130  lung]
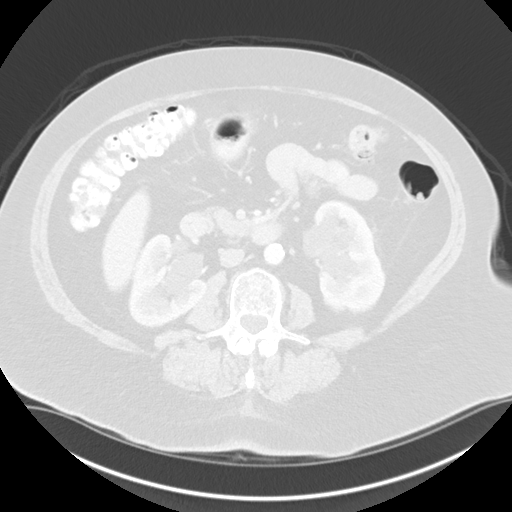
[im 71/130  lung]
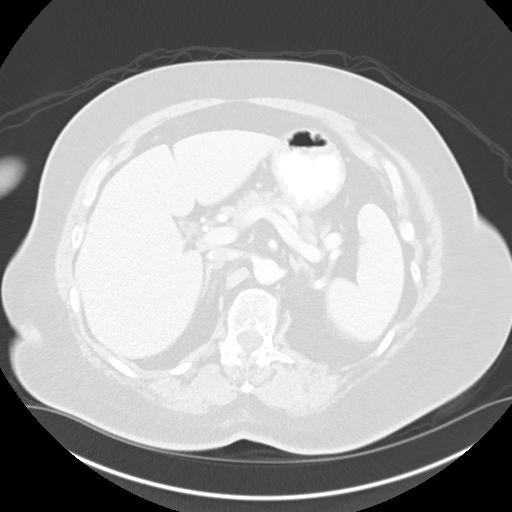
[im 83/130  lung]
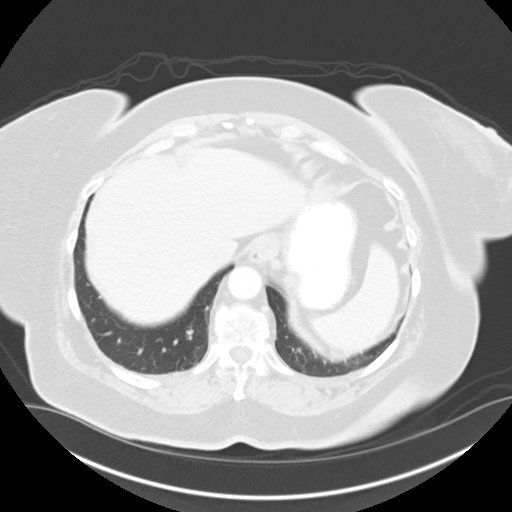
[im 94/130  lung]
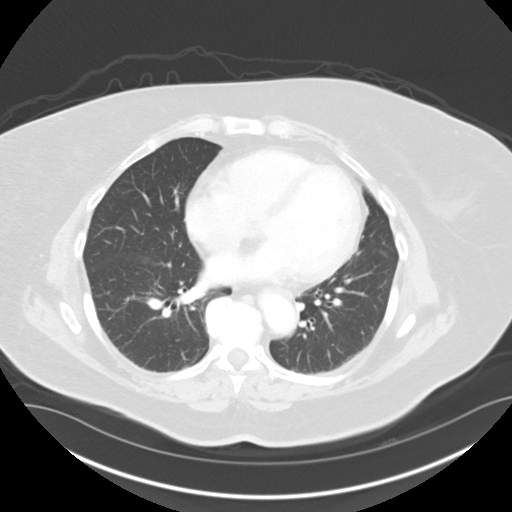
[im 106/130  mediastinal]
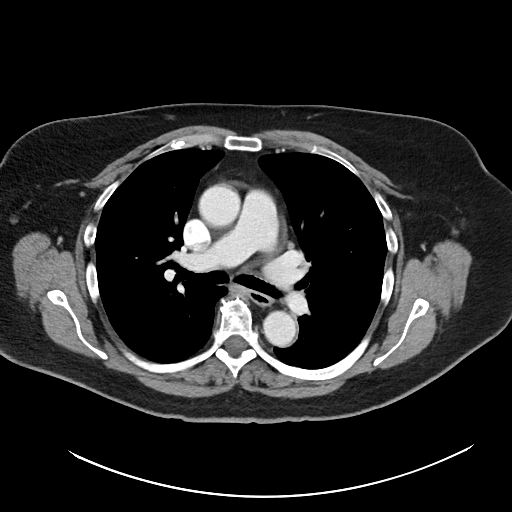
[im 106/130  lung]
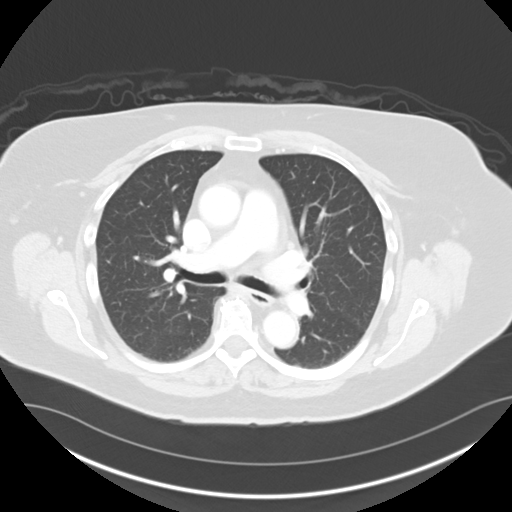
[im 118/130  lung]
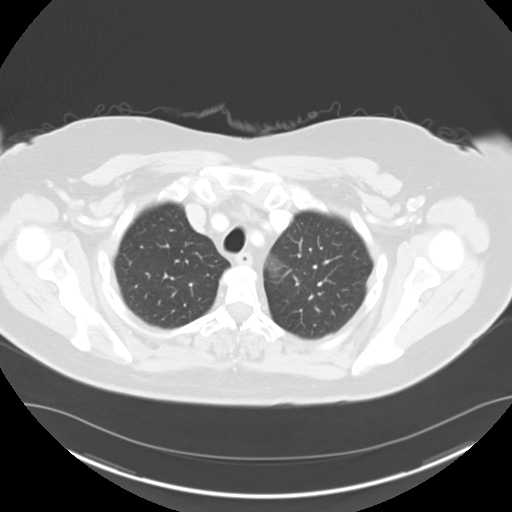

[Series 4: coronals cap (person_name) · coronal · 0.81mm/px · 3 of 181 slices shown]
[im 37/181  lung]
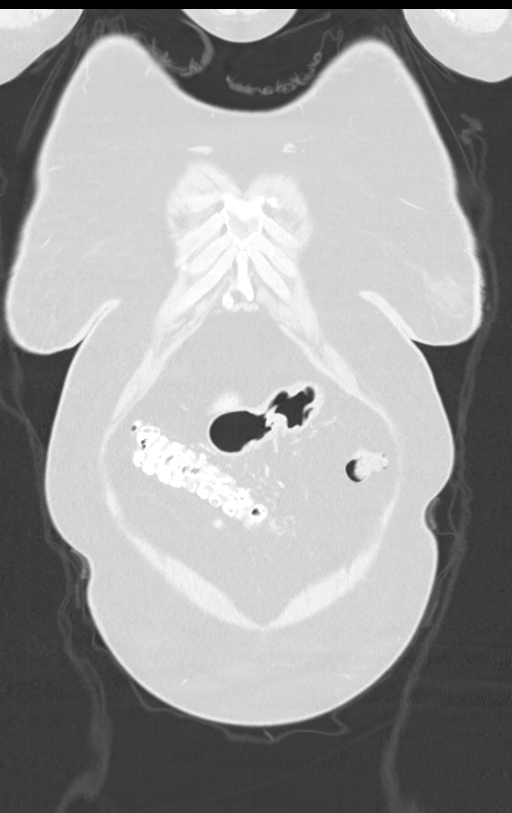
[im 73/181  lung]
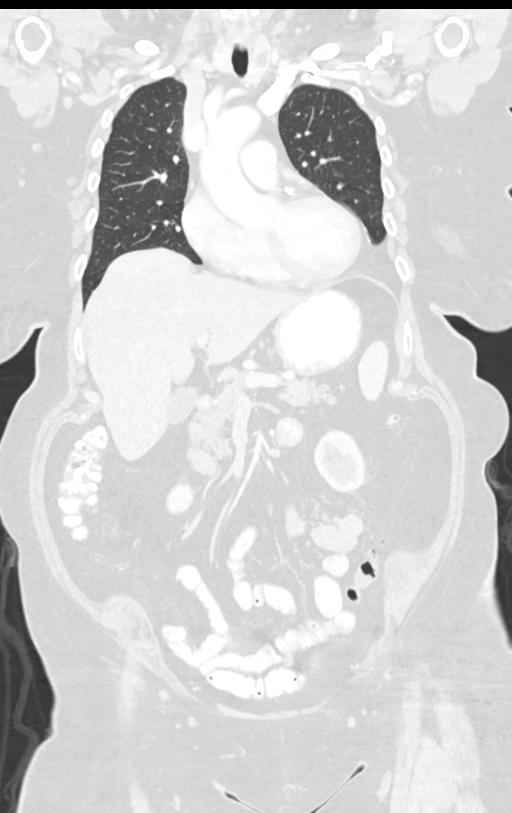
[im 109/181  lung]
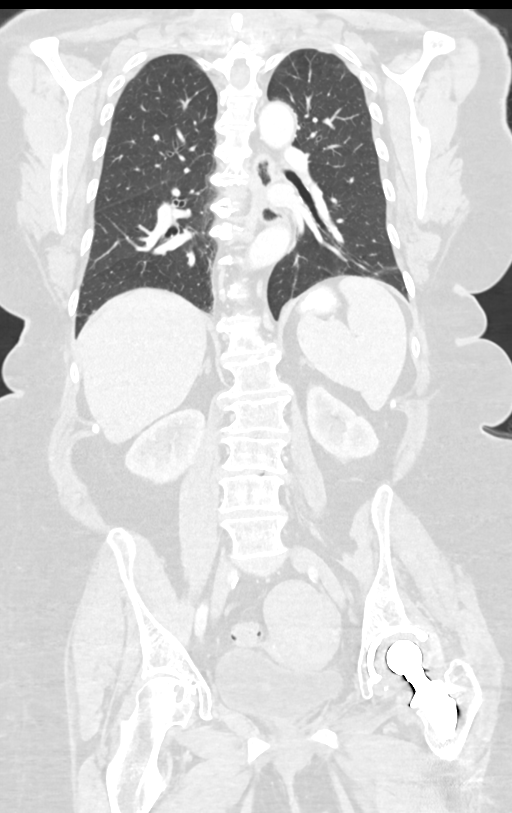

[13 of 36 positions shown; findings below may reference images not displayed]

FINDINGS: CT CHEST FINDINGS

Cardiovascular: Scattered aortic and coronary artery calcifications.
Heart is normal size. Aorta is normal caliber.

Mediastinum/Nodes: No mediastinal, hilar, or axillary adenopathy.
Left thyroid hypodense nodule measures 14 mm.

Lungs/Pleura: Linear bibasilar scarring. No confluent airspace
opacities, suspicious pulmonary nodules or pleural effusions.

Musculoskeletal: Degenerative changes in the thoracic spine.
Postoperative changes in the lower cervical spine.

CT ABDOMEN PELVIS FINDINGS

Hepatobiliary: Small hypodensity in the left hepatic lobe measures
12 mm, likely small cyst. No biliary ductal dilatation. Gallbladder
unremarkable.

Pancreas: No focal abnormality or ductal dilatation.

Spleen: No focal abnormality.  Normal size.

Adrenals/Urinary Tract: Bilateral renal parapelvic cysts. No
hydronephrosis. No adrenal mass. Urinary bladder is unremarkable.

Stomach/Bowel: Diffuse colonic diverticulosis, most pronounced in
the descending and sigmoid colon segments. No active diverticulitis.
Appendix is normal. Stomach and small bowel decompressed,
unremarkable.

Vascular/Lymphatic: Aortic atherosclerosis. No enlarged abdominal or
pelvic lymph nodes.

Reproductive: Exophytic fibroid off the fundus of the uterus
measures 5 cm. No adnexal masses.

Other: No free fluid or free air.

Musculoskeletal: No acute bony abnormality. Prior left hip
replacement.
IMPRESSION: Scattered coronary artery and aortic atherosclerosis.

No acute findings in the chest, abdomen or pelvis.

Bilateral renal parapelvic cysts.

Colonic diverticulosis.

Uterine fibroids.

## 2020-10-05 ENCOUNTER — Other Ambulatory Visit: Payer: Self-pay | Admitting: Family Medicine

## 2020-10-05 DIAGNOSIS — Z1231 Encounter for screening mammogram for malignant neoplasm of breast: Secondary | ICD-10-CM

## 2020-11-13 LAB — EXTERNAL GENERIC LAB PROCEDURE: COLOGUARD: NEGATIVE

## 2020-11-13 LAB — COLOGUARD: COLOGUARD: NEGATIVE

## 2020-11-25 ENCOUNTER — Other Ambulatory Visit: Payer: Self-pay

## 2020-11-25 ENCOUNTER — Ambulatory Visit
Admission: RE | Admit: 2020-11-25 | Discharge: 2020-11-25 | Disposition: A | Discharge status: Home / Self Care | Payer: Medicare PPO | Source: Ambulatory Visit | Attending: Family Medicine | Admitting: Family Medicine

## 2020-11-25 DIAGNOSIS — Z1231 Encounter for screening mammogram for malignant neoplasm of breast: Secondary | ICD-10-CM | POA: Insufficient documentation

## 2021-10-14 ENCOUNTER — Other Ambulatory Visit: Payer: Self-pay | Admitting: Family Medicine

## 2021-10-14 DIAGNOSIS — Z1231 Encounter for screening mammogram for malignant neoplasm of breast: Secondary | ICD-10-CM

## 2021-11-28 ENCOUNTER — Ambulatory Visit
Admission: RE | Admit: 2021-11-28 | Discharge: 2021-11-28 | Disposition: A | Discharge status: Home / Self Care | Payer: Medicare PPO | Source: Ambulatory Visit | Attending: Family Medicine | Admitting: Family Medicine

## 2021-11-28 DIAGNOSIS — Z1231 Encounter for screening mammogram for malignant neoplasm of breast: Secondary | ICD-10-CM | POA: Insufficient documentation

## 2022-11-01 ENCOUNTER — Other Ambulatory Visit: Payer: Self-pay | Admitting: Family Medicine

## 2022-11-01 DIAGNOSIS — Z1231 Encounter for screening mammogram for malignant neoplasm of breast: Secondary | ICD-10-CM

## 2022-12-05 ENCOUNTER — Ambulatory Visit
Admission: RE | Admit: 2022-12-05 | Discharge: 2022-12-05 | Disposition: A | Discharge status: Home / Self Care | Payer: Medicare PPO | Source: Ambulatory Visit | Attending: Family Medicine | Admitting: Family Medicine

## 2022-12-05 DIAGNOSIS — Z1231 Encounter for screening mammogram for malignant neoplasm of breast: Secondary | ICD-10-CM | POA: Insufficient documentation

## 2024-01-25 ENCOUNTER — Other Ambulatory Visit: Payer: Self-pay | Admitting: Family Medicine

## 2024-01-25 DIAGNOSIS — Z1231 Encounter for screening mammogram for malignant neoplasm of breast: Secondary | ICD-10-CM

## 2024-02-26 ENCOUNTER — Ambulatory Visit
